# Patient Record
Sex: Male | Born: 1990 | Race: White | Hispanic: No | Marital: Single | Smoking: Never smoker
Health system: Southern US, Community
[De-identification: ages and names within clinical notes are randomized; demographics above are authoritative.]

## PROBLEM LIST (undated history)

## (undated) DIAGNOSIS — K769 Liver disease, unspecified: Secondary | ICD-10-CM

## (undated) HISTORY — PX: OTHER SURGICAL HISTORY: SHX169

---

## 2019-05-28 ENCOUNTER — Other Ambulatory Visit: Payer: Self-pay

## 2019-05-28 ENCOUNTER — Emergency Department (HOSPITAL_COMMUNITY)
Admission: EM | Admit: 2019-05-28 | Discharge: 2019-05-28 | Disposition: A | Discharge status: Home / Self Care | Payer: Self-pay | Attending: Emergency Medicine | Admitting: Emergency Medicine

## 2019-05-28 ENCOUNTER — Encounter (HOSPITAL_COMMUNITY): Payer: Self-pay | Admitting: *Deleted

## 2019-05-28 ENCOUNTER — Emergency Department (HOSPITAL_COMMUNITY): Payer: Self-pay

## 2019-05-28 DIAGNOSIS — R059 Cough, unspecified: Secondary | ICD-10-CM

## 2019-05-28 DIAGNOSIS — R509 Fever, unspecified: Secondary | ICD-10-CM | POA: Insufficient documentation

## 2019-05-28 DIAGNOSIS — R112 Nausea with vomiting, unspecified: Secondary | ICD-10-CM | POA: Insufficient documentation

## 2019-05-28 DIAGNOSIS — Z20822 Contact with and (suspected) exposure to covid-19: Secondary | ICD-10-CM | POA: Insufficient documentation

## 2019-05-28 DIAGNOSIS — R05 Cough: Secondary | ICD-10-CM | POA: Insufficient documentation

## 2019-05-28 HISTORY — DX: Liver disease, unspecified: K76.9

## 2019-05-28 LAB — COMPREHENSIVE METABOLIC PANEL
ALT: 21 U/L (ref 0–44)
AST: 21 U/L (ref 15–41)
Albumin: 3.9 g/dL (ref 3.5–5.0)
Alkaline Phosphatase: 61 U/L (ref 38–126)
Anion gap: 15 (ref 5–15)
BUN: 18 mg/dL (ref 6–20)
CO2: 19 mmol/L — ABNORMAL LOW (ref 22–32)
Calcium: 9.6 mg/dL (ref 8.9–10.3)
Chloride: 103 mmol/L (ref 98–111)
Creatinine, Ser: 0.89 mg/dL (ref 0.61–1.24)
GFR calc Af Amer: >60 mL/min (ref >60)
GFR calc non Af Amer: >60 mL/min (ref >60)
Glucose, Bld: 85 mg/dL (ref 70–99)
Potassium: 4.2 mmol/L (ref 3.5–5.1)
Sodium: 137 mmol/L (ref 135–145)
Total Bilirubin: 1.3 mg/dL — ABNORMAL HIGH (ref 0.3–1.2)
Total Protein: 8.2 g/dL — ABNORMAL HIGH (ref 6.5–8.1)

## 2019-05-28 LAB — CBC WITH DIFFERENTIAL/PLATELET
Abs Immature Granulocytes: 0.03 K/uL (ref 0.00–0.07)
Basophils Absolute: 0 K/uL (ref 0.0–0.1)
Basophils Relative: 0 %
Eosinophils Absolute: 0.2 K/uL (ref 0.0–0.5)
Eosinophils Relative: 2 %
HCT: 45.6 % (ref 39.0–52.0)
Hemoglobin: 15.3 g/dL (ref 13.0–17.0)
Immature Granulocytes: 0 %
Lymphocytes Relative: 24 %
Lymphs Abs: 2.2 K/uL (ref 0.7–4.0)
MCH: 29.4 pg (ref 26.0–34.0)
MCHC: 33.6 g/dL (ref 30.0–36.0)
MCV: 87.5 fL (ref 80.0–100.0)
Monocytes Absolute: 1.3 K/uL — ABNORMAL HIGH (ref 0.1–1.0)
Monocytes Relative: 14 %
Neutro Abs: 5.5 K/uL (ref 1.7–7.7)
Neutrophils Relative %: 60 %
Platelets: 319 K/uL (ref 150–400)
RBC: 5.21 MIL/uL (ref 4.22–5.81)
RDW: 12.8 % (ref 11.5–15.5)
WBC: 9.3 K/uL (ref 4.0–10.5)
nRBC: 0 % (ref 0.0–0.2)

## 2019-05-28 LAB — POC SARS CORONAVIRUS 2 AG -  ED: SARS Coronavirus 2 Ag: NEGATIVE

## 2019-05-28 LAB — LIPASE, BLOOD: Lipase: 17 U/L (ref 11–51)

## 2019-05-28 MED ORDER — SODIUM CHLORIDE 0.9 % IV BOLUS
1000.0000 mL | Freq: Once | INTRAVENOUS | Status: AC
  Administered 2019-05-28: 17:00:00 1000 mL via INTRAVENOUS

## 2019-05-28 MED ORDER — BENZONATATE 100 MG PO CAPS: 100.0000 mg | ORAL_CAPSULE | Freq: Three times a day (TID) | ORAL | 0 refills | Status: AC

## 2019-05-28 NOTE — ED Triage Notes (Signed)
Pt reports having a productive cough x 4-5 days with fever. Mask on pt, no distress noted at triage.

## 2019-05-28 NOTE — Discharge Instructions (Addendum)
Your laboratory results are within normal limits today.  Your chest x-ray did not show any pneumonia.  I prescribed a short course of Acacian to help with your cough, please take these as directed.  You may continue to treat your fever at home with Tylenol, push fluids along with follow-up with PCP after isolation for the next 14 days.

## 2019-05-28 NOTE — ED Provider Notes (Signed)
[facility] EMERGENCY DEPARTMENT Provider Note   CSN: [id number] Arrival date & time: 05/28/19  0940     History Chief Complaint  Patient presents with  . Fever  . Cough    Donald Stevenson is a 29 y.o. male.  29 y.o male with a PMH of liver disease presents to the ED with a chief complaint of generalized body aches, fevers, cough.  Patient reports a friend of his tested positive for Covid, he was told this after the fact.  Today he is chest pressure with coughing.  He also reports T-max of 101 while at home, he has been taking Tylenol along with Robitussin for his symptoms without improvement.  He also endorses lower abdominal pain, states this is sharp, worse with movement along with eating.  He has had multiple episodes of nonbilious, nonbloody emesis.  Reports last time he ate was 3 days ago.  He denies any headache, prior history of CAD, smoking history or PMH of blood clots.     The history is provided by the patient and medical records.  Fever Associated symptoms: cough, nausea, rhinorrhea and vomiting   Associated symptoms: no chest pain, no headaches and no sore throat   Cough Associated symptoms: fever and rhinorrhea   Associated symptoms: no chest pain, no headaches and no sore throat        Past Medical History:  Diagnosis Date  . Liver disease      Social History   Tobacco Use  . Smoking status: Not on file  Substance Use Topics  . Alcohol use: Not on file  . Drug use: Yes    Types: Marijuana    Home Medications Prior to Admission medications   Medication Sig Start Date End Date Taking? Authorizing Provider  benzonatate (TESSALON) 100 MG capsule Take 1 capsule (100 mg total) by mouth every 8 (eight) hours for 7 days. 05/28/19 06/04/19  Claude Manges, PA-C    Allergies    Morphine and related and Phenergan [promethazine]  Review of Systems   Review of Systems  Constitutional: Positive for fever.  HENT: Positive for rhinorrhea.  Negative for sinus pressure, sinus pain and sore throat.   Respiratory: Positive for cough and chest tightness.   Cardiovascular: Negative for chest pain.  Gastrointestinal: Positive for abdominal pain, nausea and vomiting.  Genitourinary: Negative for flank pain.  Musculoskeletal: Negative for back pain.  Skin: Negative for pallor and wound.  Neurological: Negative for light-headedness and headaches.    Physical Exam Updated Vital Signs BP 123/89   Pulse 85   Temp 98.1 F (36.7 C) (Oral)   Resp 17   Ht 5\' 6"  (1.676 m)   Wt 59 kg   SpO2 96%   BMI 20.98 kg/m   Physical Exam Vitals and nursing note reviewed.  Constitutional:      Appearance: Normal appearance.  HENT:     Head: Normocephalic and atraumatic.     Nose: Rhinorrhea present.     Mouth/Throat:     Mouth: Mucous membranes are dry.  Eyes:     Pupils: Pupils are equal, round, and reactive to light.  Cardiovascular:     Rate and Rhythm: Normal rate.  Pulmonary:     Effort: Pulmonary effort is normal.  Abdominal:     General: Abdomen is flat.     Tenderness: There is abdominal tenderness. There is guarding. There is no right CVA tenderness, left CVA tenderness or rebound.     Hernia: No hernia is  present.  Musculoskeletal:     Cervical back: Normal range of motion and neck supple.  Skin:    General: Skin is warm and dry.  Neurological:     Mental Status: He is alert and oriented to person, place, and time.     ED Results / Procedures / Treatments   Labs (all labs ordered are listed, but only abnormal results are displayed) Labs Reviewed  CBC WITH DIFFERENTIAL/PLATELET - Abnormal; Notable for the following components:      Result Value   Monocytes Absolute 1.3 (*)    All other components within normal limits  COMPREHENSIVE METABOLIC PANEL - Abnormal; Notable for the following components:   CO2 19 (*)    Total Protein 8.2 (*)    Total Bilirubin 1.3 (*)    All other components within normal limits    LIPASE, BLOOD  POC SARS CORONAVIRUS 2 AG -  ED    EKG None  Radiology DG Chest Portable 1 View  Result Date: 05/28/2019 CLINICAL DATA:  Cough. EXAM: PORTABLE CHEST 1 VIEW COMPARISON:  None. FINDINGS: The heart size and mediastinal contours are within normal limits. Both lungs are clear. The visualized skeletal structures are unremarkable. IMPRESSION: No active disease. Electronically Signed   By: Aram Candela M.D.   On: 05/28/2019 16:29    Procedures Procedures (including critical care time)  Medications Ordered in ED Medications  sodium chloride 0.9 % bolus 1,000 mL (1,000 mLs Intravenous New Bag/Given 05/28/19 1717)    ED Course  I have reviewed the triage vital signs and the nursing notes.  Pertinent labs & imaging results that were available during my care of the patient were reviewed by me and considered in my medical decision making (see chart for details).    MDM Rules/Calculators/A&P   Patient with a past medical history of liver disease presents to the ED with complaints of cough, body aches, fevers for the past several days.  According to patient he was exposed to his friend who tested positive for COVID-19 infection.  He reports has been treating his symptoms at home with Robitussin, Tylenol, fluids but has not had any improvement in symptoms.  He reports the cough has been severe nature, he has had some nausea along with episodes of post tussive emesis.  He also endorses anorexia, states his last meal was 2 days ago.  He arrived in the ED afebrile, heart rate of 126, reports he had just finished vomiting in the parking lot.  Patient is saturations around 97% on room air.  No tachypnea present.  He does endorse some abdominal pain, more so on the lower aspect, does not have any urinary symptoms.  Basic blood work was obtained for further screening.  CBC with no leukocytosis, hemoglobin is within normal limits. CMP without any electrolyte abnormality.  Creatinine level  is within normal limits.  LFTs are unremarkable, no signs of injury at this time.  Lipase level is within normal limits.  Rapid Covid test was obtained which was negative.  Chest x-ray did not show any signs of consolidation, pneumothorax, pleural effusion.  Patient received a liter bolus while in the ED,  Derwood Becraft was evaluated in Emergency Department on 05/28/2019 for the symptoms described in the history of present illness. He was evaluated in the context of the global COVID-19 pandemic, which necessitated consideration that the patient might be at risk for infection with the SARS-CoV-2 virus that causes COVID-19. Institutional protocols and algorithms that pertain to the evaluation of  patients at risk for COVID-19 are in a state of rapid change based on information released by regulatory bodies including the CDC and federal and state organizations. These policies and algorithms were followed during the patient's care in the ED.    Portions of this note were generated with [employer]. Dictation errors may occur despite best attempts at proofreading.  Final Clinical Impression(s) / ED Diagnoses Final diagnoses:  Cough  Suspected COVID-19 virus infection    Rx / DC Orders ED Discharge Orders         Ordered    benzonatate (TESSALON) 100 MG capsule  Every 8 hours     05/28/19 1802           Janeece Fitting, PA-C 05/28/19 1814    Dorie Rank, MD 05/29/19 2311

## 2019-12-11 ENCOUNTER — Other Ambulatory Visit: Payer: Self-pay

## 2019-12-11 ENCOUNTER — Encounter (HOSPITAL_COMMUNITY): Payer: Self-pay | Admitting: Emergency Medicine

## 2019-12-11 ENCOUNTER — Emergency Department (HOSPITAL_COMMUNITY): Payer: Self-pay

## 2019-12-11 ENCOUNTER — Emergency Department (HOSPITAL_COMMUNITY)
Admission: EM | Admit: 2019-12-11 | Discharge: 2019-12-12 | Disposition: A | Discharge status: Home / Self Care | Payer: Self-pay | Attending: Emergency Medicine | Admitting: Emergency Medicine

## 2019-12-11 DIAGNOSIS — R05 Cough: Secondary | ICD-10-CM | POA: Insufficient documentation

## 2019-12-11 DIAGNOSIS — R0789 Other chest pain: Secondary | ICD-10-CM | POA: Insufficient documentation

## 2019-12-11 DIAGNOSIS — Z5321 Procedure and treatment not carried out due to patient leaving prior to being seen by health care provider: Secondary | ICD-10-CM | POA: Insufficient documentation

## 2019-12-11 DIAGNOSIS — R0602 Shortness of breath: Secondary | ICD-10-CM | POA: Insufficient documentation

## 2019-12-11 DIAGNOSIS — R1110 Vomiting, unspecified: Secondary | ICD-10-CM | POA: Insufficient documentation

## 2019-12-11 LAB — CBC
HCT: 43.6 % (ref 39.0–52.0)
Hemoglobin: 14.2 g/dL (ref 13.0–17.0)
MCH: 29.6 pg (ref 26.0–34.0)
MCHC: 32.6 g/dL (ref 30.0–36.0)
MCV: 91 fL (ref 80.0–100.0)
Platelets: 341 10*3/uL (ref 150–400)
RBC: 4.79 MIL/uL (ref 4.22–5.81)
RDW: 13.2 % (ref 11.5–15.5)
WBC: 10.2 10*3/uL (ref 4.0–10.5)
nRBC: 0 % (ref 0.0–0.2)

## 2019-12-11 MED ORDER — SODIUM CHLORIDE 0.9% FLUSH: 3.0000 mL | Freq: Once | INTRAVENOUS | Status: DC

## 2019-12-11 NOTE — ED Triage Notes (Signed)
Patient reports chronic left upper chest pain for 3 months with SOB , emesis and occasional dry cough , denies fever or diaphoresis .

## 2019-12-12 ENCOUNTER — Other Ambulatory Visit: Payer: Self-pay

## 2019-12-12 ENCOUNTER — Emergency Department (HOSPITAL_COMMUNITY)
Admission: EM | Admit: 2019-12-12 | Discharge: 2019-12-12 | Disposition: A | Discharge status: Home / Self Care | Payer: Self-pay | Attending: Emergency Medicine | Admitting: Emergency Medicine

## 2019-12-12 ENCOUNTER — Emergency Department (HOSPITAL_COMMUNITY): Payer: Self-pay

## 2019-12-12 ENCOUNTER — Encounter (HOSPITAL_COMMUNITY): Payer: Self-pay | Admitting: Emergency Medicine

## 2019-12-12 DIAGNOSIS — R0789 Other chest pain: Secondary | ICD-10-CM | POA: Insufficient documentation

## 2019-12-12 DIAGNOSIS — Z20822 Contact with and (suspected) exposure to covid-19: Secondary | ICD-10-CM | POA: Insufficient documentation

## 2019-12-12 DIAGNOSIS — R079 Chest pain, unspecified: Secondary | ICD-10-CM

## 2019-12-12 DIAGNOSIS — R109 Unspecified abdominal pain: Secondary | ICD-10-CM | POA: Insufficient documentation

## 2019-12-12 DIAGNOSIS — R5383 Other fatigue: Secondary | ICD-10-CM | POA: Insufficient documentation

## 2019-12-12 DIAGNOSIS — R112 Nausea with vomiting, unspecified: Secondary | ICD-10-CM | POA: Insufficient documentation

## 2019-12-12 LAB — BASIC METABOLIC PANEL
Anion gap: 11 (ref 5–15)
Anion gap: 8 (ref 5–15)
BUN: 11 mg/dL (ref 6–20)
BUN: 14 mg/dL (ref 6–20)
CO2: 26 mmol/L (ref 22–32)
CO2: 26 mmol/L (ref 22–32)
Calcium: 9.7 mg/dL (ref 8.9–10.3)
Calcium: 9.8 mg/dL (ref 8.9–10.3)
Chloride: 106 mmol/L (ref 98–111)
Chloride: 107 mmol/L (ref 98–111)
Creatinine, Ser: 0.72 mg/dL (ref 0.61–1.24)
Creatinine, Ser: 0.91 mg/dL (ref 0.61–1.24)
GFR calc Af Amer: >60 mL/min (ref >60)
GFR calc Af Amer: >60 mL/min (ref >60)
GFR calc non Af Amer: >60 mL/min (ref >60)
GFR calc non Af Amer: >60 mL/min (ref >60)
Glucose, Bld: 86 mg/dL (ref 70–99)
Glucose, Bld: 94 mg/dL (ref 70–99)
Potassium: 4.9 mmol/L (ref 3.5–5.1)
Potassium: 5 mmol/L (ref 3.5–5.1)
Sodium: 141 mmol/L (ref 135–145)
Sodium: 143 mmol/L (ref 135–145)

## 2019-12-12 LAB — HEPATIC FUNCTION PANEL
ALT: 16 U/L (ref 0–44)
AST: 19 U/L (ref 15–41)
Albumin: 4.6 g/dL (ref 3.5–5.0)
Alkaline Phosphatase: 55 U/L (ref 38–126)
Bilirubin, Direct: <0.1 mg/dL (ref 0.0–0.2)
Total Bilirubin: 0.7 mg/dL (ref 0.3–1.2)
Total Protein: 8.2 g/dL — ABNORMAL HIGH (ref 6.5–8.1)

## 2019-12-12 LAB — TROPONIN I (HIGH SENSITIVITY)
Troponin I (High Sensitivity): 2 ng/L (ref <18)
Troponin I (High Sensitivity): <2 ng/L (ref <18)
Troponin I (High Sensitivity): <2 ng/L (ref <18)
Troponin I (High Sensitivity): <2 ng/L (ref <18)

## 2019-12-12 LAB — CBC
HCT: 43.1 % (ref 39.0–52.0)
Hemoglobin: 14 g/dL (ref 13.0–17.0)
MCH: 29.7 pg (ref 26.0–34.0)
MCHC: 32.5 g/dL (ref 30.0–36.0)
MCV: 91.3 fL (ref 80.0–100.0)
Platelets: 322 10*3/uL (ref 150–400)
RBC: 4.72 MIL/uL (ref 4.22–5.81)
RDW: 13.3 % (ref 11.5–15.5)
WBC: 9.4 10*3/uL (ref 4.0–10.5)
nRBC: 0 % (ref 0.0–0.2)

## 2019-12-12 LAB — SARS CORONAVIRUS 2 BY RT PCR (HOSPITAL ORDER, PERFORMED IN ~~LOC~~ HOSPITAL LAB): SARS Coronavirus 2: NEGATIVE

## 2019-12-12 LAB — LIPASE, BLOOD: Lipase: 26 U/L (ref 11–51)

## 2019-12-12 MED ORDER — PANTOPRAZOLE SODIUM 20 MG PO TBEC
20.0000 mg | DELAYED_RELEASE_TABLET | Freq: Every day | ORAL | 0 refills | Status: AC
Start: 2019-12-12 — End: ?

## 2019-12-12 MED ORDER — SODIUM CHLORIDE 0.9% FLUSH: 3.0000 mL | Freq: Once | INTRAVENOUS | Status: DC

## 2019-12-12 NOTE — ED Provider Notes (Addendum)
[facility]-EMERGENCY DEPT Provider Note   CSN: [id number] Arrival date & time: 12/12/19  0720     History Chief Complaint  Patient presents with  . Chest Pain    Donald Stevenson is a 29 y.o. male.  HPI  29 year old male with history of liver disease, and self-reported gastric ulcer diagnosed in 2019, presents to the ER with fatigue, chest pain, and consistent nausea and vomiting in the mornings which then resolves throughout the day.  Patient states that he has been having most of the symptoms over the last 2 years, however the chest pain has become more constant this last week.  He reports pain to the left upper chest near his armpit, states that it is worse with sneezing or coughing and describes it as sharp and stabbing.  He endorses smoking marijuana, and states that he has stopped smoking this week because his chest pain has been getting worse.  He states that he probably should stop smoking long-term, with which I agreed.  Otherwise, he states that the pain is just waxes and wanes.  However over the last week this has become more consistent, even at rest and this prompted him to come to the ER.  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  He also reports some weight loss due to decreased appetite.  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  He states that when he was diagnosed with the ulcer, he was prescribed Protonix but never filled this prescription.  He states he now regrets it as he thinks that this is contributing to his symptoms.  Originally denying of abdominal pain, however upon further questioning he states that he will occasionally get some left lower quadrant stings but they are not consistent.  He denies any fevers or chills.  Endorses nausea and vomiting in the mornings, but then this improves throughout  the day.  He denies any shortness of breath, palpitations, headaches, constipation, diarrhea, dysuria.  He endorses a dry cough, but this has been consistent for a "long time".  He has not taken anything for his chest pain.  He denies any SI/HI, states that his mental health has been doing well.     Past Medical History:  Diagnosis Date  . Liver disease     There are no problems to display for this patient.   History reviewed. No pertinent surgical history.     History reviewed. No pertinent family history.  Social History   Tobacco Use  . Smoking status: Never Smoker  . Smokeless tobacco: Never Used  Substance Use Topics  . Alcohol use: Never  . Drug use: Yes    Types: Marijuana    Home Medications Prior to Admission medications   Medication Sig Start Date End Date Taking? Authorizing Provider  pantoprazole (PROTONIX) 20 MG tablet Take 1 tablet (20 mg total) by mouth daily. 12/12/19   Mare Ferrari, PA-C    Allergies    Morphine and related, Phenergan [promethazine], and Soy allergy  Review of Systems   Review of Systems  Constitutional: Positive for fatigue. Negative for chills and fever.  HENT: Negative for ear pain and sore throat.   Eyes: Negative for pain and visual disturbance.  Respiratory: Positive for cough. Negative for shortness of breath.   Cardiovascular: Positive  for chest pain. Negative for palpitations.  Gastrointestinal: Positive for abdominal pain, nausea and vomiting. Negative for constipation and diarrhea.  Genitourinary: Negative for dysuria and hematuria.  Musculoskeletal: Negative for arthralgias and back pain.  Skin: Negative for color change and rash.  Neurological: Negative for seizures and syncope.  All other systems reviewed and are negative.   Physical Exam Updated Vital Signs BP (!) 104/88 (BP Location: Right Arm)   Pulse 54   Temp 98.7 F (37.1 C) (Oral)   Resp 18   Ht 5\' 6"  (1.676 m)   Wt 56.7 kg   SpO2 100%   BMI 20.18  kg/m   Physical Exam Vitals and nursing note reviewed.  Constitutional:      General: He is not in acute distress.    Appearance: He is well-developed. He is not ill-appearing, toxic-appearing or diaphoretic.  HENT:     Head: Normocephalic and atraumatic.  Eyes:     Conjunctiva/sclera: Conjunctivae normal.  Cardiovascular:     Rate and Rhythm: Normal rate and regular rhythm.     Pulses:          Radial pulses are 2+ on the right side and 2+ on the left side.     Heart sounds: Normal heart sounds. No murmur heard.   Pulmonary:     Effort: Pulmonary effort is normal. No tachypnea, accessory muscle usage or respiratory distress.     Breath sounds: Normal breath sounds. No stridor. No decreased breath sounds, wheezing, rhonchi or rales.  Chest:     Chest wall: No deformity or tenderness.  Abdominal:     Palpations: Abdomen is soft.     Tenderness: There is no abdominal tenderness.  Musculoskeletal:        General: Normal range of motion.     Cervical back: Normal range of motion and neck supple.     Right lower leg: No tenderness.     Left lower leg: No tenderness. No edema.  Skin:    General: Skin is warm and dry.     Findings: No erythema or rash.  Neurological:     General: No focal deficit present.     Mental Status: He is alert.  Psychiatric:        Mood and Affect: Mood normal.        Behavior: Behavior normal.     ED Results / Procedures / Treatments   Labs (all labs ordered are listed, but only abnormal results are displayed) Labs Reviewed  HEPATIC FUNCTION PANEL - Abnormal; Notable for the following components:      Result Value   Total Protein 8.2 (*)    All other components within normal limits  SARS CORONAVIRUS 2 BY RT PCR (HOSPITAL ORDER, PERFORMED IN [facility])  BASIC METABOLIC PANEL  CBC  LIPASE, BLOOD  TROPONIN I (HIGH SENSITIVITY)  TROPONIN I (HIGH SENSITIVITY)    EKG EKG Interpretation  Date/Time:  Thursday December 12 2019  07:33:36 EDT Ventricular Rate:  59 PR Interval:    QRS Duration: 92 QT Interval:  424 QTC Calculation: 420 R Axis:   67 Text Interpretation: Sinus arrhythmia RSR' in V1 or V2, right VCD or RVH ST elev, probable normal early repol pattern 12 Lead; Mason-Likar No significant change since last tracing Confirmed by [id number] (Gwyneth Sprout) on 12/12/2019 10:03:39 AM   Radiology DG Chest 2 View  Result Date: 12/12/2019 CLINICAL DATA:  Pt c/o left pectoral chest pain x 2 months, worse this week, waking  patient and preventing sleep last night, hx smoker, denies any chest hx. EXAM: CHEST - 2 VIEW COMPARISON:  Chest radiograph 12/11/2019 FINDINGS: The heart size and mediastinal contours are within normal limits. Both lungs are clear. No pneumothorax or pleural effusion. The visualized skeletal structures are unremarkable. IMPRESSION: No acute cardiopulmonary process. Electronically Signed   By: Emmaline KluverNancy  Ballantyne M.D.   On: 12/12/2019 08:15   DG Chest 2 View  Result Date: 12/11/2019 CLINICAL DATA:  Chest pain EXAM: CHEST - 2 VIEW COMPARISON:  05/28/2019 FINDINGS: The heart size and mediastinal contours are within normal limits. Both lungs are clear. The visualized skeletal structures are unremarkable. IMPRESSION: No active cardiopulmonary disease. Electronically Signed   By: Jasmine PangKim  Fujinaga M.D.   On: 12/11/2019 23:42    Procedures Procedures (including critical care time)  Medications Ordered in ED Medications  sodium chloride flush (NS) 0.9 % injection 3 mL (3 mLs Intravenous Refused 12/12/19 [id number])    ED Course  I have reviewed the triage vital signs and the nursing notes.  Pertinent labs & imaging results that were available during my care of the patient were reviewed by me and considered in my medical decision making (see chart for details).    MDM Rules/Calculators/A&P                         29 year old male with left upper chest pain, fatigue On presentation, the patient is alert and  oriented, nontoxic-appearing, in no acute distress, speaking full sentences without increased work of breathing, nondiaphoretic.  Vitals are overall very reassuring, with no abnormalities.  Physical exam with clear lung sounds, regular rate and rhythm, mild left lower quadrant tenderness, without peritoneal signs, no rebound, no guarding.  I personally ordered and interpreted his lab work, there is no evidence of infection with a CBC without leukocytosis, normal hemoglobin.  This would not explain his fatigue.  BMP without any significant electrolyte abnormalities, normal renal function.  Normal glucose, anion gap. No evidence of new onset diabetes.  Initial troponin less than 2, given how long the patient has been here in the ER, I do not think that he needs a second troponin at this time.  Lipase normal.  Hepatic function panel with a slightly increased total protein of 8.2, however normal AST and ALT.  Chest x-ray without any evidence of pneumonia, pneumothorax, masses.  EKG with no significant changes since last tracing.  Suspicion for Covid low, but will swab today.  No evidence of ACS, doubt PE as the patient is not tachycardic, hypoxic, or short of breath.  Labs and physical exam of the abdomen do not suggest perforated ulcer, or any other surgical abdomen.  Partook in shared decision making, given the patient is uninsured, and his work-up was overall reassuring--his chest pain and abdominal pain with low concern for PE, ACS, pneumonia, perforated ulcer, surgical abdomen, patient had decided that we will forego any CT imaging of the chest and abdomen today.  He is requesting a Protonix refill, which I am happy to provide.  Patient really would benefit from further GI follow-up/evaluation.  I suspect that his chest pain could be likely caused by GERD.  I will provide a referral to him, and encouraged him to establish with a primary care doctor as well.  Unclear source of his fatigue, as his labs are normal,  and this has been a long time ongoing process.  Doubt Covid would be contributing to this.  Strict return  precautions discussed, and patient voiced understanding.  All of his questions have been answered to his satisfaction, he voices understanding and is agreeable to this plan.  At this stage in the ED course, the patient is medically screened and is stable for discharge.     Final Clinical Impression(s) / ED Diagnoses Final diagnoses:  Chest pain, unspecified type    Rx / DC Orders ED Discharge Orders         Ordered    pantoprazole (PROTONIX) 20 MG tablet  Daily     Discontinue  Reprint     12/12/19 [address] 12/12/19 1125    Gwyneth Sprout, MD 12/12/19 2157

## 2019-12-12 NOTE — Discharge Instructions (Signed)
Your lab work and work-up today was overall reassuring.  We did swab you for Covid today given you have a cough and fatigue, please make sure to quarantine until you receive the results.  If this is positive, you will need to quarantine for an additional 7 to 10 days.  Please make sure to take the Protonix as prescribed, and follow-up with the GI doctor whose contact information is provided in your discharge paperwork.  Please also call the phone number on your discharge paperwork to establish with a primary care doctor.  If you cannot afford a primary care doctor, I provided the contact information of [facility] which is a local free clinic.  If at any point you have any new or worsening symptoms, make sure to return to the ER.

## 2019-12-12 NOTE — ED Notes (Signed)
Pt checked out AMA. 

## 2019-12-12 NOTE — ED Triage Notes (Signed)
Pt comes in for chest pain that has been going on for 3 months. Pt states its been gradually getting worse for that last 3 days. Pt went to [facility] yesterday, left after blood work. Pain got worse after pt got home and went to bed.

## 2019-12-12 NOTE — ED Notes (Signed)
Patient has a extra blue top in the main lab 

## 2020-01-28 ENCOUNTER — Emergency Department (HOSPITAL_BASED_OUTPATIENT_CLINIC_OR_DEPARTMENT_OTHER): Payer: Self-pay

## 2020-01-28 ENCOUNTER — Encounter (HOSPITAL_BASED_OUTPATIENT_CLINIC_OR_DEPARTMENT_OTHER): Payer: Self-pay

## 2020-01-28 ENCOUNTER — Other Ambulatory Visit: Payer: Self-pay

## 2020-01-28 DIAGNOSIS — Z5321 Procedure and treatment not carried out due to patient leaving prior to being seen by health care provider: Secondary | ICD-10-CM | POA: Insufficient documentation

## 2020-01-28 DIAGNOSIS — Y9301 Activity, walking, marching and hiking: Secondary | ICD-10-CM | POA: Insufficient documentation

## 2020-01-28 DIAGNOSIS — Y92828 Other wilderness area as the place of occurrence of the external cause: Secondary | ICD-10-CM | POA: Insufficient documentation

## 2020-01-28 DIAGNOSIS — S0181XA Laceration without foreign body of other part of head, initial encounter: Secondary | ICD-10-CM | POA: Insufficient documentation

## 2020-01-28 DIAGNOSIS — Y999 Unspecified external cause status: Secondary | ICD-10-CM | POA: Insufficient documentation

## 2020-01-28 DIAGNOSIS — S40212A Abrasion of left shoulder, initial encounter: Secondary | ICD-10-CM | POA: Insufficient documentation

## 2020-01-28 DIAGNOSIS — W1781XA Fall down embankment (hill), initial encounter: Secondary | ICD-10-CM | POA: Insufficient documentation

## 2020-01-28 NOTE — ED Triage Notes (Signed)
Pt arrvies with laceration and hematoma to left forehead and abrasion to right forehead, reports he was hiking today when he tried to keep his dog from sliding and fell down a hill hitting his head on rocks. Pt also c/o pain to left shoulder with abrasion. He was with his brother who told him he passed out for about 10-15 seconds following the event. They drove hove home, he attempted to eat and vomited.

## 2020-01-29 ENCOUNTER — Emergency Department (HOSPITAL_BASED_OUTPATIENT_CLINIC_OR_DEPARTMENT_OTHER)
Admission: EM | Admit: 2020-01-29 | Discharge: 2020-01-29 | Disposition: A | Discharge status: Home / Self Care | Payer: Self-pay | Attending: Emergency Medicine | Admitting: Emergency Medicine

## 2020-01-29 DIAGNOSIS — W19XXXA Unspecified fall, initial encounter: Secondary | ICD-10-CM

## 2020-03-05 ENCOUNTER — Other Ambulatory Visit: Payer: Self-pay

## 2020-03-05 ENCOUNTER — Emergency Department (HOSPITAL_COMMUNITY)
Admission: EM | Admit: 2020-03-05 | Discharge: 2020-03-05 | Disposition: A | Discharge status: Home / Self Care | Payer: Medicaid Other | Attending: Emergency Medicine | Admitting: Emergency Medicine

## 2020-03-05 DIAGNOSIS — K625 Hemorrhage of anus and rectum: Secondary | ICD-10-CM | POA: Insufficient documentation

## 2020-03-05 LAB — CBC WITH DIFFERENTIAL/PLATELET
Abs Immature Granulocytes: 0.02 10*3/uL (ref 0.00–0.07)
Basophils Absolute: 0 10*3/uL (ref 0.0–0.1)
Basophils Relative: 0 %
Eosinophils Absolute: 0.1 10*3/uL (ref 0.0–0.5)
Eosinophils Relative: 1 %
HCT: 41 % (ref 39.0–52.0)
Hemoglobin: 13.9 g/dL (ref 13.0–17.0)
Immature Granulocytes: 0 %
Lymphocytes Relative: 31 %
Lymphs Abs: 2.9 10*3/uL (ref 0.7–4.0)
MCH: 30.2 pg (ref 26.0–34.0)
MCHC: 33.9 g/dL (ref 30.0–36.0)
MCV: 89.1 fL (ref 80.0–100.0)
Monocytes Absolute: 0.8 10*3/uL (ref 0.1–1.0)
Monocytes Relative: 9 %
Neutro Abs: 5.6 10*3/uL (ref 1.7–7.7)
Neutrophils Relative %: 59 %
Platelets: 275 10*3/uL (ref 150–400)
RBC: 4.6 MIL/uL (ref 4.22–5.81)
RDW: 14 % (ref 11.5–15.5)
WBC: 9.4 10*3/uL (ref 4.0–10.5)
nRBC: 0 % (ref 0.0–0.2)

## 2020-03-05 LAB — COMPREHENSIVE METABOLIC PANEL
ALT: 14 U/L (ref 0–44)
AST: 19 U/L (ref 15–41)
Albumin: 4.3 g/dL (ref 3.5–5.0)
Alkaline Phosphatase: 47 U/L (ref 38–126)
Anion gap: 13 (ref 5–15)
BUN: 18 mg/dL (ref 6–20)
CO2: 27 mmol/L (ref 22–32)
Calcium: 9.8 mg/dL (ref 8.9–10.3)
Chloride: 103 mmol/L (ref 98–111)
Creatinine, Ser: 0.88 mg/dL (ref 0.61–1.24)
GFR, Estimated: >60 mL/min (ref >60)
Glucose, Bld: 88 mg/dL (ref 70–99)
Potassium: 3.6 mmol/L (ref 3.5–5.1)
Sodium: 143 mmol/L (ref 135–145)
Total Bilirubin: 0.7 mg/dL (ref 0.3–1.2)
Total Protein: 7.9 g/dL (ref 6.5–8.1)

## 2020-03-05 LAB — TYPE AND SCREEN
ABO/RH(D): A POS
Antibody Screen: NEGATIVE

## 2020-03-05 LAB — LIPASE, BLOOD: Lipase: 25 U/L (ref 11–51)

## 2020-03-05 LAB — POC OCCULT BLOOD, ED: Fecal Occult Bld: NEGATIVE

## 2020-03-05 MED ORDER — PANTOPRAZOLE SODIUM 20 MG PO TBEC
20.0000 mg | DELAYED_RELEASE_TABLET | Freq: Every day | ORAL | 1 refills | Status: AC
Start: 2020-03-05 — End: ?

## 2020-03-05 NOTE — ED Triage Notes (Signed)
Patient here for melena, seen today and two days ago, previous episode in 2019. Patient had endoscopy/colonoscopy in 2019, diagnosed with stomach ulcer and liver disease, no testing since. Blood in stool was bright red two days ago, blood today was dark red. Patient endorses nausea/vomiting/weight loss for the past year. Patient denies diarrhea or fevers, denies any surgical history. Patient was prescribed Protonix several months ago, states it seemed to help, is currently out of med. Patient also endorses epigastric abdominal pain that radiates to the right and left sides. Patient endorses smoking weed but denies alcohol or "hard drug" use.

## 2020-03-05 NOTE — ED Provider Notes (Signed)
[name]-EMERGENCY DEPT Provider Note   CSN: [id number] Arrival date & time: 03/05/20  1903     History Chief Complaint  Patient presents with  . Melena  . Abdominal Pain    Donald Stevenson is a 29 y.o. male.  29 year old male with prior medical history as detailed below presents for evaluation. Patient reports intermittent rectal bleeding over the last 2 to 3 days. Patient denies nausea, vomiting, bloody emesis, belly pain, fever, or other complaint. Patient denies recent alcohol use. Patient reports that he saw bright red blood per rectum with attempted BM 2 days ago. Patient denies any bleeding in the last 24 hours.  The history is provided by the patient and medical records.  Illness Location:  Rectal bleeding  Severity:  Mild Onset quality:  Gradual Timing:  Constant Progression:  Waxing and waning Chronicity:  New Associated symptoms: no fever        Past Medical History:  Diagnosis Date  . Liver disease     There are no problems to display for this patient.   No past surgical history on file.     No family history on file.  Social History   Tobacco Use  . Smoking status: Never Smoker  . Smokeless tobacco: Never Used  Vaping Use  . Vaping Use: Never used  Substance Use Topics  . Alcohol use: Never  . Drug use: Not Currently    Types: Marijuana    Home Medications Prior to Admission medications   Medication Sig Start Date End Date Taking? Authorizing Provider  pantoprazole (PROTONIX) 20 MG tablet Take 1 tablet (20 mg total) by mouth daily. 12/12/19   Mare Ferrari, PA-C    Allergies    Soy allergy, Morphine and related, and Phenergan [promethazine]  Review of Systems   Review of Systems  Constitutional: Negative for fever.  All other systems reviewed and are negative.   Physical Exam Updated Vital Signs BP 111/79 (BP Location: Left Arm)   Pulse (!) 109   Temp 98.4 F (36.9 C) (Oral)   Resp 15   Ht 5\' 6"   (1.676 m)   Wt 52.6 kg   SpO2 99%   BMI 18.72 kg/m   Physical Exam Vitals and nursing note reviewed.  Constitutional:      General: He is not in acute distress.    Appearance: He is well-developed.  HENT:     Head: Normocephalic and atraumatic.  Eyes:     Conjunctiva/sclera: Conjunctivae normal.     Pupils: Pupils are equal, round, and reactive to light.  Cardiovascular:     Rate and Rhythm: Normal rate and regular rhythm.     Heart sounds: Normal heart sounds.  Pulmonary:     Effort: Pulmonary effort is normal. No respiratory distress.     Breath sounds: Normal breath sounds.  Abdominal:     General: There is no distension.     Palpations: Abdomen is soft.     Tenderness: There is no abdominal tenderness.  Genitourinary:    Comments: Normal stool, no gross blood - RN Chaperone present  Musculoskeletal:        General: No deformity. Normal range of motion.     Cervical back: Normal range of motion and neck supple.  Skin:    General: Skin is warm and dry.  Neurological:     Mental Status: He is alert and oriented to person, place, and time.     ED Results / Procedures / Treatments  Labs (all labs ordered are listed, but only abnormal results are displayed) Labs Reviewed  COMPREHENSIVE METABOLIC PANEL  CBC WITH DIFFERENTIAL/PLATELET  LIPASE, BLOOD  POC OCCULT BLOOD, ED  TYPE AND SCREEN  ABO/RH    EKG None  Radiology No results found.  Procedures Procedures (including critical care time)  Medications Ordered in ED Medications - No data to display  ED Course  I have reviewed the triage vital signs and the nursing notes.  Pertinent labs & imaging results that were available during my care of the patient were reviewed by me and considered in my medical decision making (see chart for details).    MDM Rules/Calculators/A&P                          MDM  Screen complete  Donald Stevenson was evaluated in Emergency Department on 03/05/2020 for the  symptoms described in the history of present illness. He was evaluated in the context of the global COVID-19 pandemic, which necessitated consideration that the patient might be at risk for infection with the SARS-CoV-2 virus that causes COVID-19. Institutional protocols and algorithms that pertain to the evaluation of patients at risk for COVID-19 are in a state of rapid change based on information released by regulatory bodies including the CDC and federal and state organizations. These policies and algorithms were followed during the patient's care in the ED.   Patient is presenting for reported rectal bleeding.  Patient without evidence of rectal bleeding upon this evaluation. Screening labs are without evidence of acute pathology. Patient does appear to be appropriate for discharge.  Patient does understand the need for close follow-up with GI.  Strict return precautions given and understood.   Final Clinical Impression(s) / ED Diagnoses Final diagnoses:  Rectal bleeding    Rx / DC Orders ED Discharge Orders         Ordered    pantoprazole (PROTONIX) 20 MG tablet  Daily        03/05/20 2048           Wynetta Fines, MD 03/05/20 2111

## 2020-03-05 NOTE — Discharge Instructions (Addendum)
Please return for any problem.  °

## 2020-07-03 ENCOUNTER — Emergency Department (HOSPITAL_COMMUNITY): Payer: Self-pay

## 2020-07-03 ENCOUNTER — Other Ambulatory Visit: Payer: Self-pay

## 2020-07-03 ENCOUNTER — Emergency Department (HOSPITAL_COMMUNITY)
Admission: EM | Admit: 2020-07-03 | Discharge: 2020-07-03 | Disposition: A | Discharge status: Home / Self Care | Payer: Self-pay | Attending: Emergency Medicine | Admitting: Emergency Medicine

## 2020-07-03 ENCOUNTER — Encounter (HOSPITAL_COMMUNITY): Payer: Self-pay

## 2020-07-03 DIAGNOSIS — R079 Chest pain, unspecified: Secondary | ICD-10-CM | POA: Insufficient documentation

## 2020-07-03 DIAGNOSIS — Z5321 Procedure and treatment not carried out due to patient leaving prior to being seen by health care provider: Secondary | ICD-10-CM | POA: Insufficient documentation

## 2020-07-03 LAB — CBC
HCT: 44.8 % (ref 39.0–52.0)
Hemoglobin: 14.5 g/dL (ref 13.0–17.0)
MCH: 29.5 pg (ref 26.0–34.0)
MCHC: 32.4 g/dL (ref 30.0–36.0)
MCV: 91.2 fL (ref 80.0–100.0)
Platelets: 271 10*3/uL (ref 150–400)
RBC: 4.91 MIL/uL (ref 4.22–5.81)
RDW: 13.6 % (ref 11.5–15.5)
WBC: 7.5 10*3/uL (ref 4.0–10.5)
nRBC: 0 % (ref 0.0–0.2)

## 2020-07-03 LAB — BASIC METABOLIC PANEL
Anion gap: 9 (ref 5–15)
BUN: 13 mg/dL (ref 6–20)
CO2: 26 mmol/L (ref 22–32)
Calcium: 9.6 mg/dL (ref 8.9–10.3)
Chloride: 107 mmol/L (ref 98–111)
Creatinine, Ser: 0.69 mg/dL (ref 0.61–1.24)
GFR, Estimated: >60 mL/min (ref >60)
Glucose, Bld: 105 mg/dL — ABNORMAL HIGH (ref 70–99)
Potassium: 4.7 mmol/L (ref 3.5–5.1)
Sodium: 142 mmol/L (ref 135–145)

## 2020-07-03 LAB — TROPONIN I (HIGH SENSITIVITY): Troponin I (High Sensitivity): <2 ng/L (ref <18)

## 2020-07-03 NOTE — ED Triage Notes (Signed)
Pt arrived via walk in, states he started with chest pain last night, while reading book. States worsening this morning, left sided, worsening with deep breathing.

## 2020-09-27 ENCOUNTER — Emergency Department (HOSPITAL_BASED_OUTPATIENT_CLINIC_OR_DEPARTMENT_OTHER)
Admission: EM | Admit: 2020-09-27 | Discharge: 2020-09-27 | Disposition: A | Discharge status: Home / Self Care | Payer: Medicaid Other | Attending: Emergency Medicine | Admitting: Emergency Medicine

## 2020-09-27 ENCOUNTER — Other Ambulatory Visit: Payer: Self-pay

## 2020-09-27 ENCOUNTER — Emergency Department (HOSPITAL_BASED_OUTPATIENT_CLINIC_OR_DEPARTMENT_OTHER): Payer: Medicaid Other

## 2020-09-27 ENCOUNTER — Encounter (HOSPITAL_BASED_OUTPATIENT_CLINIC_OR_DEPARTMENT_OTHER): Payer: Self-pay | Admitting: *Deleted

## 2020-09-27 DIAGNOSIS — Z23 Encounter for immunization: Secondary | ICD-10-CM | POA: Insufficient documentation

## 2020-09-27 DIAGNOSIS — W260XXA Contact with knife, initial encounter: Secondary | ICD-10-CM | POA: Insufficient documentation

## 2020-09-27 DIAGNOSIS — S61412A Laceration without foreign body of left hand, initial encounter: Secondary | ICD-10-CM

## 2020-09-27 DIAGNOSIS — S61422A Laceration with foreign body of left hand, initial encounter: Secondary | ICD-10-CM | POA: Insufficient documentation

## 2020-09-27 DIAGNOSIS — Y93G1 Activity, food preparation and clean up: Secondary | ICD-10-CM | POA: Insufficient documentation

## 2020-09-27 MED ORDER — CEPHALEXIN 500 MG PO CAPS: 500.0000 mg | ORAL_CAPSULE | Freq: Four times a day (QID) | ORAL | 0 refills | Status: DC

## 2020-09-27 MED ORDER — CEPHALEXIN 500 MG PO CAPS: 500.0000 mg | ORAL_CAPSULE | Freq: Four times a day (QID) | ORAL | 0 refills | Status: AC

## 2020-09-27 MED ORDER — TETANUS-DIPHTH-ACELL PERTUSSIS 5-2.5-18.5 LF-MCG/0.5 IM SUSY
0.5000 mL | PREFILLED_SYRINGE | Freq: Once | INTRAMUSCULAR | Status: AC
  Administered 2020-09-27: 0.5 mL via INTRAMUSCULAR
  Filled 2020-09-27: qty 0.5

## 2020-09-27 NOTE — Discharge Instructions (Signed)
Keep the wound cleanwith soap and water. Make sure to pat dry the wound before covering it with any dressing. You can use topical antibiotic ointment and bandage. Ice and elevate for pain relief.   Take antibiotics as directed. Please take all of your antibiotics until finished.  You can take Tylenol or Ibuprofen as directed for pain. You can alternate Tylenol and Ibuprofen every 4 hours for additional pain relief.   Follow up with the referred hand doctor if you continue to have numbness.   Monitor closely for any signs of infection. Return to the Emergency Department for any worsening redness/swelling of the area that begins to spread, drainage from the site, worsening pain, fever or any other worsening or concerning symptoms.

## 2020-09-27 NOTE — ED Notes (Signed)
Patient transported to X-ray 

## 2020-09-27 NOTE — ED Triage Notes (Signed)
Pt reports he injured his left palm with a knife on Friday night. Reports cutting an avocado pit out and his dog jumped on him causing the knife to go into his palm. Good ROM. Bruising noted to palm. Denies pain

## 2020-09-27 NOTE — ED Provider Notes (Signed)
[facility] EMERGENCY DEPARTMENT Provider Note   CSN: [id number] Arrival date & time: 09/27/20  1414     History Chief Complaint  Patient presents with  . Hand Injury    Donald Stevenson is a 30 y.o. male who presents for evaluation of injury noted to left hand.  He reports that about 2 days ago, he was cutting an avocado and states that the knife slipped, causing a puncture wound to the palmar surface of his left hand.  He states that he cleaned the area off.  Over the last 2 days, he has noted some bruising and swelling to the palm.  He states that he started noticing some worsening bruising which made him concerned.  He also reports he has had some scattered numbness to that hand.  He does not know when his last tetanus shot was.  He denies any overlying warmth, erythema, drainage, fevers, weakness.  The history is provided by the patient.       Past Medical History:  Diagnosis Date  . Liver disease     There are no problems to display for this patient.   Past Surgical History:  Procedure Laterality Date  . skin graft surgery         No family history on file.  Social History   Tobacco Use  . Smoking status: Never Smoker  . Smokeless tobacco: Never Used  Vaping Use  . Vaping Use: Never used  Substance Use Topics  . Alcohol use: Never  . Drug use: Yes    Types: Marijuana    Home Medications Prior to Admission medications   Medication Sig Start Date End Date Taking? Authorizing Provider  cephALEXin (KEFLEX) 500 MG capsule Take 1 capsule (500 mg total) by mouth 4 (four) times daily for 7 days. 09/27/20 10/04/20  Maxwell Caul, PA-C  pantoprazole (PROTONIX) 20 MG tablet Take 1 tablet (20 mg total) by mouth daily. 12/12/19   Mare Ferrari, PA-C  pantoprazole (PROTONIX) 20 MG tablet Take 1 tablet (20 mg total) by mouth daily. 03/05/20   Wynetta Fines, MD    Allergies    Soy allergy, Morphine and related, and Phenergan [promethazine]  Review of  Systems   Review of Systems  Constitutional: Negative for fever.  Skin: Positive for wound. Negative for color change.  Neurological: Positive for numbness. Negative for weakness.  All other systems reviewed and are negative.   Physical Exam Updated Vital Signs BP 111/79 (BP Location: Right Arm)   Pulse 92   Temp 98.6 F (37 C) (Oral)   Resp 18   Ht 5\' 6"  (1.676 m)   Wt 56.7 kg   SpO2 99%   BMI 20.18 kg/m   Physical Exam Vitals and nursing note reviewed.  Constitutional:      Appearance: He is well-developed.  HENT:     Head: Normocephalic and atraumatic.  Eyes:     General: No scleral icterus.       Right eye: No discharge.        Left eye: No discharge.     Conjunctiva/sclera: Conjunctivae normal.  Cardiovascular:     Pulses:          Radial pulses are 2+ on the right side and 2+ on the left side.  Pulmonary:     Effort: Pulmonary effort is normal.  Musculoskeletal:     Comments: No bony tenderness noted to the 5 digits of the left upper extremity.  Full flexion/extension both at the  PIP and the DIP joint intact with any difficulty.  No bony tenderness in the wrist, forearm.  Skin:    General: Skin is warm and dry.     Comments: Good distal cap refill. LUE is not dusky in appearance or cool to touch.  Small 1 cm puncture wound to the palmar surface of the left hand, about 2 cm proximal to the LUE index finger with surrounding ecchymosis.  No surrounding warmth, erythema.  No active drainage.  Neurological:     Mental Status: He is alert.     Comments: Reports decreased sensation to the palmar aspect of his left hand as well as the fifth, third, thumb digits of his left hand.  He has normal sensation to the fourth and index finger.  He also reports some decrease sensation to the dorsal surface of his palm near the radial aspect.  When I use a sharp point to sensation.  On digits thumb, third digit, fifth digit, he can feel pressure but cannot feel specific sharpness.     Psychiatric:        Speech: Speech normal.        Behavior: Behavior normal.     ED Results / Procedures / Treatments   Labs (all labs ordered are listed, but only abnormal results are displayed) Labs Reviewed - No data to display  EKG None  Radiology DG Hand Complete Left  Result Date: 09/27/2020 CLINICAL DATA:  Recent puncture wound with new onset left hand numbness, initial encounter EXAM: LEFT HAND - COMPLETE 3+ VIEW COMPARISON:  None. FINDINGS: There is no evidence of fracture or dislocation. There is no evidence of arthropathy or other focal bone abnormality. Soft tissues are unremarkable. IMPRESSION: No acute abnormality noted. Electronically Signed   By: Alcide Clever M.D.   On: 09/27/2020 15:25    Procedures Procedures   Medications Ordered in ED Medications  Tdap (BOOSTRIX) injection 0.5 mL (0.5 mLs Intramuscular Given 09/27/20 1537)    ED Course  I have reviewed the triage vital signs and the nursing notes.  Pertinent labs & imaging results that were available during my care of the patient were reviewed by me and considered in my medical decision making (see chart for details).    MDM Rules/Calculators/A&P                          30 year old male who presents for evaluation of wound noted to the left hand.  Reports 2 days ago, he was cutting an avocado and the knife slipped, causing a injury to the palmar surface of his hand.  Unknown last tetanus.  Reports he noticed some worsening bruising, numbness to the palm. On initial arrival, he is afebrile, toxic appearing.  Vital signs are stable.  On exam, he has equal radial pulses bilaterally.  He has good cap refill, no dusky appearance in the digits.  The wound itself is about 1 cm puncture wound surrounding ecchymosis.  No overlying warmth, erythema.  No active drainage.  He has some decrease sensation distal to the injury but only to the fifth, third and thumb digits of the left hand.  He also reports some numbness to  the dorsal surface towards the radial aspect.  Clinically, this does not follow a specific nerve distribution.  Question if this is more of a shock in her situation but also consider potential nerve deficit from injury.  We will update tetanus, obtain x-ray to ensure there is no  bony abnormality.  X-rays reviewed.  There is no acute bony abnormality.  Will place him on antibiotics given that is on his hand to prevent infection.  Patient is allergic to soy, morphine, Phenergan.  Patient instructed to follow-up with referred hand doctor for further evaluation and management of his numbness.  At this time, he does not have an emergent vascular injury that would need emergent intervention.  We will give him outpatient hand follow up. At this time, patient exhibits no emergent life-threatening condition that require further evaluation in ED. Patient had ample opportunity for questions and discussion. All patient's questions were answered with full understanding. Strict return precautions discussed. Patient expresses understanding and agreement to plan.   Portions of this note were generated with Scientist, clinical (histocompatibility and immunogenetics). Dictation errors may occur despite best attempts at proofreading.  Final Clinical Impression(s) / ED Diagnoses Final diagnoses:  Laceration of left hand, foreign body presence unspecified, initial encounter    Rx / DC Orders ED Discharge Orders         Ordered    cephALEXin (KEFLEX) 500 MG capsule  4 times daily,   Status:  Discontinued        09/27/20 1539    cephALEXin (KEFLEX) 500 MG capsule  4 times daily        09/27/20 1543           Maxwell Caul, PA-C 09/27/20 1714    Charlynne Pander, MD 09/27/20 [phone]

## 2020-09-27 NOTE — ED Notes (Signed)
Pt verbalized understanding to pick up prescriptions at pharmacy listed on d/c instructions.  °

## 2020-09-27 NOTE — ED Notes (Signed)
ED Provider at bedside. 

## 2022-01-09 ENCOUNTER — Emergency Department (HOSPITAL_COMMUNITY)
Admission: EM | Admit: 2022-01-09 | Discharge: 2022-01-09 | Disposition: A | Discharge status: Home / Self Care | Payer: Medicaid Other | Attending: Emergency Medicine | Admitting: Emergency Medicine

## 2022-01-09 ENCOUNTER — Other Ambulatory Visit: Payer: Self-pay

## 2022-01-09 ENCOUNTER — Encounter (HOSPITAL_COMMUNITY): Payer: Self-pay

## 2022-01-09 DIAGNOSIS — E162 Hypoglycemia, unspecified: Secondary | ICD-10-CM

## 2022-01-09 DIAGNOSIS — R1013 Epigastric pain: Secondary | ICD-10-CM | POA: Insufficient documentation

## 2022-01-09 DIAGNOSIS — R059 Cough, unspecified: Secondary | ICD-10-CM | POA: Insufficient documentation

## 2022-01-09 DIAGNOSIS — Z20822 Contact with and (suspected) exposure to covid-19: Secondary | ICD-10-CM | POA: Insufficient documentation

## 2022-01-09 DIAGNOSIS — R7309 Other abnormal glucose: Secondary | ICD-10-CM | POA: Insufficient documentation

## 2022-01-09 DIAGNOSIS — R112 Nausea with vomiting, unspecified: Secondary | ICD-10-CM | POA: Insufficient documentation

## 2022-01-09 LAB — URINALYSIS, ROUTINE W REFLEX MICROSCOPIC
Bacteria, UA: NONE SEEN
Bilirubin Urine: NEGATIVE
Glucose, UA: NEGATIVE mg/dL
Hgb urine dipstick: NEGATIVE
Ketones, ur: 80 mg/dL — AB
Leukocytes,Ua: NEGATIVE
Nitrite: NEGATIVE
Protein, ur: 30 mg/dL — AB
Specific Gravity, Urine: 1.027 (ref 1.005–1.030)
pH: 5 (ref 5.0–8.0)

## 2022-01-09 LAB — CBC WITH DIFFERENTIAL/PLATELET
Abs Immature Granulocytes: 0.03 10*3/uL (ref 0.00–0.07)
Basophils Absolute: 0 10*3/uL (ref 0.0–0.1)
Basophils Relative: 0 %
Eosinophils Absolute: 0.1 10*3/uL (ref 0.0–0.5)
Eosinophils Relative: 1 %
HCT: 50.3 % (ref 39.0–52.0)
Hemoglobin: 16.4 g/dL (ref 13.0–17.0)
Immature Granulocytes: 0 %
Lymphocytes Relative: 19 %
Lymphs Abs: 2 10*3/uL (ref 0.7–4.0)
MCH: 29.7 pg (ref 26.0–34.0)
MCHC: 32.6 g/dL (ref 30.0–36.0)
MCV: 91 fL (ref 80.0–100.0)
Monocytes Absolute: 0.5 10*3/uL (ref 0.1–1.0)
Monocytes Relative: 5 %
Neutro Abs: 8.3 10*3/uL — ABNORMAL HIGH (ref 1.7–7.7)
Neutrophils Relative %: 75 %
Platelets: 373 10*3/uL (ref 150–400)
RBC: 5.53 MIL/uL (ref 4.22–5.81)
RDW: 13.5 % (ref 11.5–15.5)
WBC: 11 10*3/uL — ABNORMAL HIGH (ref 4.0–10.5)
nRBC: 0 % (ref 0.0–0.2)

## 2022-01-09 LAB — COMPREHENSIVE METABOLIC PANEL
ALT: 20 U/L (ref 0–44)
AST: 27 U/L (ref 15–41)
Albumin: 4.9 g/dL (ref 3.5–5.0)
Alkaline Phosphatase: 59 U/L (ref 38–126)
Anion gap: 15 (ref 5–15)
BUN: 26 mg/dL — ABNORMAL HIGH (ref 6–20)
CO2: 21 mmol/L — ABNORMAL LOW (ref 22–32)
Calcium: 10 mg/dL (ref 8.9–10.3)
Chloride: 102 mmol/L (ref 98–111)
Creatinine, Ser: 0.95 mg/dL (ref 0.61–1.24)
GFR, Estimated: >60 mL/min (ref >60)
Glucose, Bld: 63 mg/dL — ABNORMAL LOW (ref 70–99)
Potassium: 4.2 mmol/L (ref 3.5–5.1)
Sodium: 138 mmol/L (ref 135–145)
Total Bilirubin: 1 mg/dL (ref 0.3–1.2)
Total Protein: 9.3 g/dL — ABNORMAL HIGH (ref 6.5–8.1)

## 2022-01-09 LAB — RESP PANEL BY RT-PCR (FLU A&B, COVID) ARPGX2
Influenza A by PCR: NEGATIVE
Influenza B by PCR: NEGATIVE
SARS Coronavirus 2 by RT PCR: NEGATIVE

## 2022-01-09 LAB — CBG MONITORING, ED
Glucose-Capillary: 50 mg/dL — ABNORMAL LOW (ref 70–99)
Glucose-Capillary: 56 mg/dL — ABNORMAL LOW (ref 70–99)
Glucose-Capillary: 122 mg/dL — ABNORMAL HIGH (ref 70–99)

## 2022-01-09 LAB — LIPASE, BLOOD: Lipase: 23 U/L (ref 11–51)

## 2022-01-09 MED ORDER — ONDANSETRON HCL 4 MG/2ML IJ SOLN
4.0000 mg | Freq: Once | INTRAMUSCULAR | Status: AC
  Administered 2022-01-09: 4 mg via INTRAVENOUS
  Filled 2022-01-09: qty 2

## 2022-01-09 MED ORDER — ONDANSETRON 4 MG PO TBDP: ORAL_TABLET | ORAL | 0 refills | Status: AC

## 2022-01-09 MED ORDER — FAMOTIDINE IN NACL 20-0.9 MG/50ML-% IV SOLN
20.0000 mg | Freq: Once | INTRAVENOUS | Status: AC
  Administered 2022-01-09: 20 mg via INTRAVENOUS
  Filled 2022-01-09: qty 50

## 2022-01-09 MED ORDER — SODIUM CHLORIDE 0.9 % IV BOLUS
1000.0000 mL | Freq: Once | INTRAVENOUS | Status: AC
  Administered 2022-01-09: 1000 mL via INTRAVENOUS

## 2022-01-09 NOTE — ED Provider Triage Note (Signed)
Emergency Medicine Provider Triage Evaluation Note  Donald Stevenson , a 31 y.o. male  was evaluated in triage.  Pt complains of n/v since yesterday afternoon. Associated constant epigastric abdominal pain. Described as achy. One episode of diarrhea. No sick contacts. No fevers or chills.   Review of Systems  Positive:  Negative:   Physical Exam  BP 111/70   Pulse 86   Temp 97.8 F (36.6 C)   Resp 19   Ht 5\' 6"  (1.676 m)   Wt 59 kg   SpO2 99%   BMI 20.98 kg/m  Gen:   Awake, no distress   Resp:  Normal effort  MSK:   Moves extremities without difficulty  Other:  +epigastric pain, murphy neg   Medical Decision Making  Medically screening exam initiated at 2:28 PM.  Appropriate orders placed.  Donald Stevenson was informed that the remainder of the evaluation will be completed by another provider, this initial triage assessment does not replace that evaluation, and the importance of remaining in the ED until their evaluation is complete.     Donald Stevenson, Donald Stevenson 01/09/22 1429

## 2022-01-09 NOTE — ED Provider Notes (Signed)
[facility]-EMERGENCY DEPT Provider Note   CSN: [id number] Arrival date & time: 01/09/22  1325     History  Chief Complaint  Patient presents with   Nausea   Emesis   Abdominal Pain   Cough    Donald Stevenson is a 31 y.o. male history of gastritis here presenting with nausea vomiting and cough.  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  He states that he was unable to keep anything down this morning.  Patient has some epigastric pain as well.  She has some chills but denies any fevers.  The history is provided by the patient.       Home Medications Prior to Admission medications   Medication Sig Start Date End Date Taking? Authorizing Provider  pantoprazole (PROTONIX) 20 MG tablet Take 1 tablet (20 mg total) by mouth daily. 12/12/19   Mare Ferrari, PA-C  pantoprazole (PROTONIX) 20 MG tablet Take 1 tablet (20 mg total) by mouth daily. 03/05/20   Wynetta Fines, MD      Allergies    Soy allergy, Morphine and related, and Phenergan [promethazine]    Review of Systems   Review of Systems  Respiratory:  Positive for cough.   Gastrointestinal:  Positive for abdominal pain and vomiting.  All other systems reviewed and are negative.   Physical Exam Updated Vital Signs BP 113/73 (BP Location: Left Arm)   Pulse 68   Temp 97.7 F (36.5 C) (Oral)   Resp 18   Ht 5\' 6"  (1.676 m)   Wt 59 kg   SpO2 100%   BMI 20.98 kg/m  Physical Exam Vitals and nursing note reviewed.  HENT:     Head: Normocephalic.     Mouth/Throat:     Mouth: Mucous membranes are moist.  Eyes:     Extraocular Movements: Extraocular movements intact.     Pupils: Pupils are equal, round, and reactive to light.  Cardiovascular:     Rate and Rhythm: Normal rate and regular rhythm.     Heart sounds: Normal heart sounds.  Pulmonary:     Effort: Pulmonary effort is normal.     Breath sounds: Normal  breath sounds.  Abdominal:     General: Abdomen is flat.     Palpations: Abdomen is soft.  Skin:    General: Skin is warm.     Capillary Refill: Capillary refill takes less than 2 seconds.  Neurological:     General: No focal deficit present.     Mental Status: He is alert and oriented to person, place, and time.  Psychiatric:        Mood and Affect: Mood normal.        Behavior: Behavior normal.     ED Results / Procedures / Treatments   Labs (all labs ordered are listed, but only abnormal results are displayed) Labs Reviewed  RESP PANEL BY RT-PCR (FLU A&B, COVID) ARPGX2  CBC WITH DIFFERENTIAL/PLATELET  COMPREHENSIVE METABOLIC PANEL  LIPASE, BLOOD  URINALYSIS, ROUTINE W REFLEX MICROSCOPIC    EKG None  Radiology No results found.  Procedures Procedures    Medications Ordered in ED Medications  sodium chloride 0.9 % bolus 1,000 mL (has no administration in time range)  ondansetron (ZOFRAN) injection 4 mg (has no administration in time range)  famotidine (PEPCID) IVPB 20 mg premix (has no administration in time range)    ED Course/ Medical Decision  Making/ A&P                           Medical Decision Making Donald Stevenson is a 31 y.o. male here presenting with vomiting.  Likely viral gastroenteritis.  Plan to get CBC and CMP and lipase and hydrate patient and reassess.  6:11 PM I reviewed patient's labs.  Patient's initial labs showed glucose of 50.  Patient was given 2 L bolus and Zofran.  Patient able to tolerate p.o. and glucose up to 122 now.  He is feeling much better.  He is stable for discharge  Problems Addressed: Nausea and vomiting, unspecified vomiting type: acute illness or injury  Amount and/or Complexity of Data Reviewed Labs: ordered. Decision-making details documented in ED Course.  Risk Prescription drug management.    Final Clinical Impression(s) / ED Diagnoses Final diagnoses:  None    Rx / DC Orders ED Discharge Orders      None         Charlynne Pander, MD 01/09/22 [phone]

## 2022-01-09 NOTE — ED Notes (Signed)
Pt A&OX4 ambulatory at d/c with independent steady gait. Pt verbalized understanding of d/c instructions, prescription and follow up care. 

## 2022-01-09 NOTE — ED Notes (Signed)
CBG 50. Pt given two orange juices. Dr. Silverio Lay informed.

## 2022-01-09 NOTE — ED Triage Notes (Signed)
Pt c/o abdominal pain, nausea, vomiting, and a cough since yesterday.

## 2022-01-09 NOTE — Discharge Instructions (Addendum)
Take Zofran for nausea  Stay hydrated   See your doctor for follow up   Return to ER if you have worse abdominal pain, vomiting, dehydration

## 2022-01-09 NOTE — ED Notes (Signed)
Pt given a urinal and informed a urine sample is needed. He was instructed to use call bell when he has been able to void.

## 2022-02-06 ENCOUNTER — Emergency Department (HOSPITAL_BASED_OUTPATIENT_CLINIC_OR_DEPARTMENT_OTHER)
Admission: EM | Admit: 2022-02-06 | Discharge: 2022-02-06 | Disposition: A | Discharge status: Home / Self Care | Payer: Self-pay | Attending: Emergency Medicine | Admitting: Emergency Medicine

## 2022-02-06 ENCOUNTER — Emergency Department (HOSPITAL_BASED_OUTPATIENT_CLINIC_OR_DEPARTMENT_OTHER): Payer: Self-pay | Admitting: Radiology

## 2022-02-06 ENCOUNTER — Encounter (HOSPITAL_BASED_OUTPATIENT_CLINIC_OR_DEPARTMENT_OTHER): Payer: Self-pay | Admitting: Emergency Medicine

## 2022-02-06 ENCOUNTER — Emergency Department (HOSPITAL_BASED_OUTPATIENT_CLINIC_OR_DEPARTMENT_OTHER): Payer: Self-pay

## 2022-02-06 DIAGNOSIS — T189XXA Foreign body of alimentary tract, part unspecified, initial encounter: Secondary | ICD-10-CM | POA: Insufficient documentation

## 2022-02-06 DIAGNOSIS — X58XXXA Exposure to other specified factors, initial encounter: Secondary | ICD-10-CM | POA: Insufficient documentation

## 2022-02-06 NOTE — ED Provider Notes (Signed)
[facility] EMERGENCY DEPT Provider Note  CSN: [id number] Arrival date & time: 02/06/22 [id number]  Chief Complaint(s) Swallowed Foreign Body  HPI Donald Stevenson is a 31 y.o. male     Swallowed Foreign Body This is a new problem. The current episode started less than 1 hour ago. Pertinent negatives include no chest pain, no abdominal pain, no headaches and no shortness of breath. Nothing aggravates the symptoms. Nothing relieves the symptoms.   Patient reports that he was eating a corn dog and believes he swallowed the wooden stick.  Past Medical History Past Medical History:  Diagnosis Date   Liver disease    There are no problems to display for this patient.  Home Medication(s) Prior to Admission medications   Medication Sig Start Date End Date Taking? Authorizing Provider  ondansetron (ZOFRAN-ODT) 4 MG disintegrating tablet 4mg  ODT q4 hours prn nausea/vomit 01/09/22   Drenda Freeze, MD  pantoprazole (PROTONIX) 20 MG tablet Take 1 tablet (20 mg total) by mouth daily. 12/12/19   Garald Balding, PA-C  pantoprazole (PROTONIX) 20 MG tablet Take 1 tablet (20 mg total) by mouth daily. 03/05/20   Valarie Merino, MD                                                                                                                                    Allergies Soy allergy, Morphine and related, and Phenergan [promethazine]  Review of Systems Review of Systems  Respiratory:  Negative for shortness of breath.   Cardiovascular:  Negative for chest pain.  Gastrointestinal:  Negative for abdominal pain.  Neurological:  Negative for headaches.   As noted in HPI  Physical Exam Vital Signs  I have reviewed the triage vital signs BP 117/87   Pulse 98   Temp 98.3 F (36.8 C) (Oral)   Resp 18   SpO2 95%   Physical Exam Vitals reviewed.  Constitutional:      General: He is not in acute distress.    Appearance: He is well-developed. He is not diaphoretic.  HENT:      Head: Normocephalic and atraumatic.     Right Ear: External ear normal.     Left Ear: External ear normal.     Nose: Nose normal.     Mouth/Throat:     Mouth: Mucous membranes are moist. No injury or lacerations.     Tongue: No lesions.     Palate: No lesions.     Pharynx: No posterior oropharyngeal erythema.     Tonsils: No tonsillar exudate or tonsillar abscesses.  Eyes:     General: No scleral icterus.    Conjunctiva/sclera: Conjunctivae normal.  Neck:     Trachea: Phonation normal.  Cardiovascular:     Rate and Rhythm: Normal rate and regular rhythm.  Pulmonary:     Effort: Pulmonary effort is normal. No respiratory distress.     Breath sounds: No stridor.  Abdominal:  General: There is no distension.  Musculoskeletal:        General: Normal range of motion.     Cervical back: Normal range of motion.  Neurological:     Mental Status: He is alert and oriented to person, place, and time.  Psychiatric:        Behavior: Behavior normal.     ED Results and Treatments Labs (all labs ordered are listed, but only abnormal results are displayed) Labs Reviewed - No data to display                                                                                                                       EKG  EKG Interpretation  Date/Time:    Ventricular Rate:    PR Interval:    QRS Duration:   QT Interval:    QTC Calculation:   R Axis:     Text Interpretation:         Radiology CT Chest Wo Contrast  Result Date: 02/06/2022 CLINICAL DATA:  Possible ingestion of wood foreign body. EXAM: CT CHEST WITHOUT CONTRAST TECHNIQUE: Multidetector CT imaging of the chest was performed following the standard protocol without IV contrast. RADIATION DOSE REDUCTION: This exam was performed according to the departmental dose-optimization program which includes automated exposure control, adjustment of the mA and/or kV according to patient size and/or use of iterative reconstruction  technique. COMPARISON:  02/06/2022. FINDINGS: Cardiovascular: No significant vascular findings. Normal heart size. No pericardial effusion. Mediastinum/Nodes: No enlarged mediastinal or axillary lymph nodes. Thyroid gland, trachea, and esophagus demonstrate no significant findings. No radiopaque foreign body is identified. Lungs/Pleura: No consolidation, effusion, or pneumothorax. Scattered pulmonary nodules are noted bilaterally. The largest nodule in the right lower lobe measures 7 mm, axial image 142. The largest nodule in the left lower lobe measures 5 mm, axial image 93. There are ground-glass opacities with focal bronchiectasis in the right lower lobe measuring up to 1 cm. Upper Abdomen: Ingested debris is present in the stomach. No radiopaque foreign body. Musculoskeletal: No chest wall mass or suspicious bone lesions identified. IMPRESSION: 1. No evidence of radiopaque foreign body. 2. Multiple pulmonary nodules bilaterally measuring up to 7 mm. Non-contrast chest CT at 3-6 months is recommended. If the nodules are stable at time of repeat CT, then future CT at 18-24 months (from today's scan) is considered optional for low-risk patients, but is recommended for high-risk patients. This recommendation follows the consensus statement: Guidelines for Management of Incidental Pulmonary Nodules Detected on CT Images: From the Fleischner Society 2017; Radiology 2017; 284:228-243. Electronically Signed   By: Brett Fairy M.D.   On: 02/06/2022 04:24   DG Chest 2 View  Result Date: 02/06/2022 CLINICAL DATA:  Swallowed foreign body. EXAM: CHEST - 2 VIEW COMPARISON:  Chest x-ray 07/03/2020 FINDINGS: No foreign body identified. The heart size and mediastinal contours are within normal limits. Both lungs are clear. The visualized skeletal structures are unremarkable. IMPRESSION: No active cardiopulmonary disease. Electronically Signed  By: Ronney Asters M.D.   On: 02/06/2022 03:16   DG Neck Soft Tissue  Result  Date: 02/06/2022 CLINICAL DATA:  Swallowed foreign body. EXAM: NECK SOFT TISSUES - 1+ VIEW COMPARISON:  None Available. FINDINGS: There is no evidence of retropharyngeal soft tissue swelling or epiglottic enlargement. The cervical airway is unremarkable and no radio-opaque foreign body identified. IMPRESSION: No radiopaque foreign body identified. Electronically Signed   By: Ronney Asters M.D.   On: 02/06/2022 03:13    Medications Ordered in ED Medications - No data to display                                                                                                                                   Procedures Procedures  (including critical care time)  Medical Decision Making / ED Course   Medical Decision Making Amount and/or Complexity of Data Reviewed Radiology: ordered and independent interpretation performed. Decision-making details documented in ED Course.    Patient concerned for possible swallowed wood FB. Imaging w/o obvious FB. No airway compromise. Tolerating PO       Final Clinical Impression(s) / ED Diagnoses Final diagnoses:  Foreign body, swallowed, initial encounter   The patient appears reasonably screened and/or stabilized for discharge and I doubt any other medical condition or other [facility] requiring further screening, evaluation, or treatment in the ED at this time. I have discussed the findings, Dx and Tx plan with the patient/family who expressed understanding and agree(s) with the plan. Discharge instructions discussed at length. The patient/family was given strict return precautions who verbalized understanding of the instructions. No further questions at time of discharge.  Disposition: Discharge  Condition: Good  ED Discharge Orders     None        Follow Up: Primary care provider  Call  as needed           This chart was dictated using voice recognition software.  Despite best efforts to proofread,  errors can occur which can  change the documentation meaning.    Fatima Blank, MD 02/06/22 [phone]

## 2022-02-06 NOTE — ED Notes (Signed)
ED Provider at bedside. 

## 2022-02-06 NOTE — ED Triage Notes (Addendum)
Pt presents to ED POV. Pt reports he was eating corn dog when he noted half the stick gone. Pt reports he can feel stick in throat. Pt reports heavy marijuana use this evening. Airway intact.

## 2022-06-15 ENCOUNTER — Emergency Department (HOSPITAL_COMMUNITY): Payer: Self-pay

## 2022-06-15 ENCOUNTER — Emergency Department (HOSPITAL_COMMUNITY)
Admission: EM | Admit: 2022-06-15 | Discharge: 2022-06-16 | Disposition: A | Discharge status: Home / Self Care | Payer: Self-pay | Attending: Student | Admitting: Student

## 2022-06-15 ENCOUNTER — Encounter (HOSPITAL_COMMUNITY): Payer: Self-pay

## 2022-06-15 ENCOUNTER — Other Ambulatory Visit: Payer: Self-pay

## 2022-06-15 DIAGNOSIS — R000 Tachycardia, unspecified: Secondary | ICD-10-CM | POA: Insufficient documentation

## 2022-06-15 DIAGNOSIS — Z1152 Encounter for screening for COVID-19: Secondary | ICD-10-CM | POA: Insufficient documentation

## 2022-06-15 DIAGNOSIS — R1013 Epigastric pain: Secondary | ICD-10-CM | POA: Insufficient documentation

## 2022-06-15 DIAGNOSIS — R112 Nausea with vomiting, unspecified: Secondary | ICD-10-CM | POA: Insufficient documentation

## 2022-06-15 LAB — CBC WITH DIFFERENTIAL/PLATELET
Abs Immature Granulocytes: 0.03 10*3/uL (ref 0.00–0.07)
Basophils Absolute: 0 10*3/uL (ref 0.0–0.1)
Basophils Relative: 0 %
Eosinophils Absolute: 0 10*3/uL (ref 0.0–0.5)
Eosinophils Relative: 0 %
HCT: 49.9 % (ref 39.0–52.0)
Hemoglobin: 16.3 g/dL (ref 13.0–17.0)
Immature Granulocytes: 0 %
Lymphocytes Relative: 32 %
Lymphs Abs: 3.6 10*3/uL (ref 0.7–4.0)
MCH: 29.4 pg (ref 26.0–34.0)
MCHC: 32.7 g/dL (ref 30.0–36.0)
MCV: 90.1 fL (ref 80.0–100.0)
Monocytes Absolute: 1.2 10*3/uL — ABNORMAL HIGH (ref 0.1–1.0)
Monocytes Relative: 11 %
Neutro Abs: 6.4 10*3/uL (ref 1.7–7.7)
Neutrophils Relative %: 57 %
Platelets: 436 10*3/uL — ABNORMAL HIGH (ref 150–400)
RBC: 5.54 MIL/uL (ref 4.22–5.81)
RDW: 13.9 % (ref 11.5–15.5)
WBC: 11.4 10*3/uL — ABNORMAL HIGH (ref 4.0–10.5)
nRBC: 0 % (ref 0.0–0.2)

## 2022-06-15 LAB — URINALYSIS, ROUTINE W REFLEX MICROSCOPIC
Bacteria, UA: NONE SEEN
Bilirubin Urine: NEGATIVE
Glucose, UA: NEGATIVE mg/dL
Ketones, ur: 80 mg/dL — AB
Nitrite: NEGATIVE
Protein, ur: 30 mg/dL — AB
Specific Gravity, Urine: >1.046 — ABNORMAL HIGH (ref 1.005–1.030)
pH: 5 (ref 5.0–8.0)

## 2022-06-15 LAB — COMPREHENSIVE METABOLIC PANEL
ALT: 17 U/L (ref 0–44)
AST: 29 U/L (ref 15–41)
Albumin: 4.7 g/dL (ref 3.5–5.0)
Alkaline Phosphatase: 61 U/L (ref 38–126)
Anion gap: 20 — ABNORMAL HIGH (ref 5–15)
BUN: 25 mg/dL — ABNORMAL HIGH (ref 6–20)
CO2: 13 mmol/L — ABNORMAL LOW (ref 22–32)
Calcium: 9.6 mg/dL (ref 8.9–10.3)
Chloride: 101 mmol/L (ref 98–111)
Creatinine, Ser: 1.26 mg/dL — ABNORMAL HIGH (ref 0.61–1.24)
GFR, Estimated: >60 mL/min (ref >60)
Glucose, Bld: 66 mg/dL — ABNORMAL LOW (ref 70–99)
Potassium: 4.7 mmol/L (ref 3.5–5.1)
Sodium: 134 mmol/L — ABNORMAL LOW (ref 135–145)
Total Bilirubin: 1.5 mg/dL — ABNORMAL HIGH (ref 0.3–1.2)
Total Protein: 9.4 g/dL — ABNORMAL HIGH (ref 6.5–8.1)

## 2022-06-15 LAB — RESP PANEL BY RT-PCR (RSV, FLU A&B, COVID)  RVPGX2
Influenza A by PCR: NEGATIVE
Influenza B by PCR: NEGATIVE
Resp Syncytial Virus by PCR: NEGATIVE
SARS Coronavirus 2 by RT PCR: NEGATIVE

## 2022-06-15 LAB — LIPASE, BLOOD: Lipase: 28 U/L (ref 11–51)

## 2022-06-15 IMAGING — DX DG SHOULDER 2+V*L*
3 series · 3 of 3 positions shown · non-contrast
Comparison: None.

CLINICAL DATA: Fall off [place] with left shoulder pain.

EXAM:
LEFT SHOULDER - 2+ VIEW

[shoulder grashey]
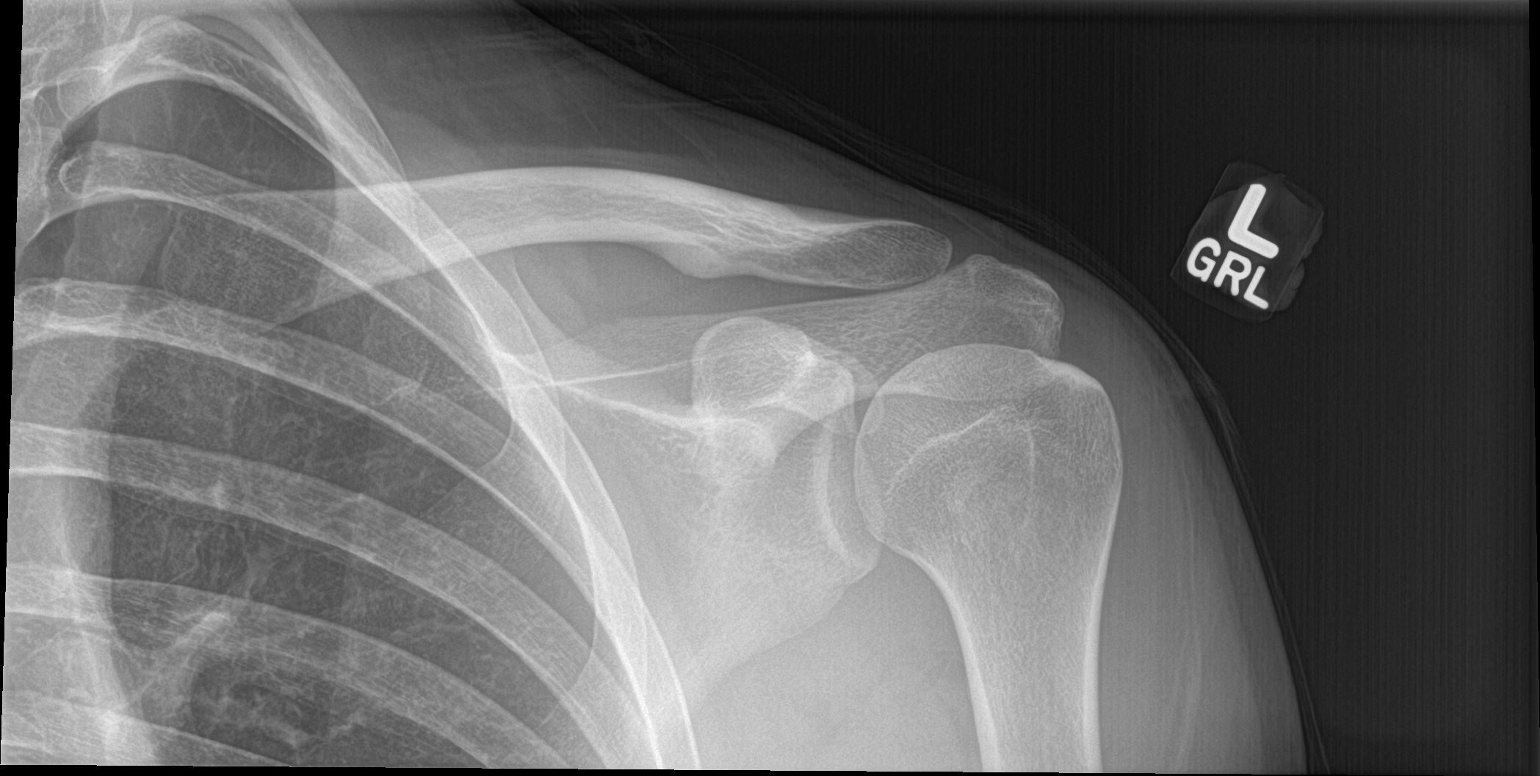

[shoulder y view]
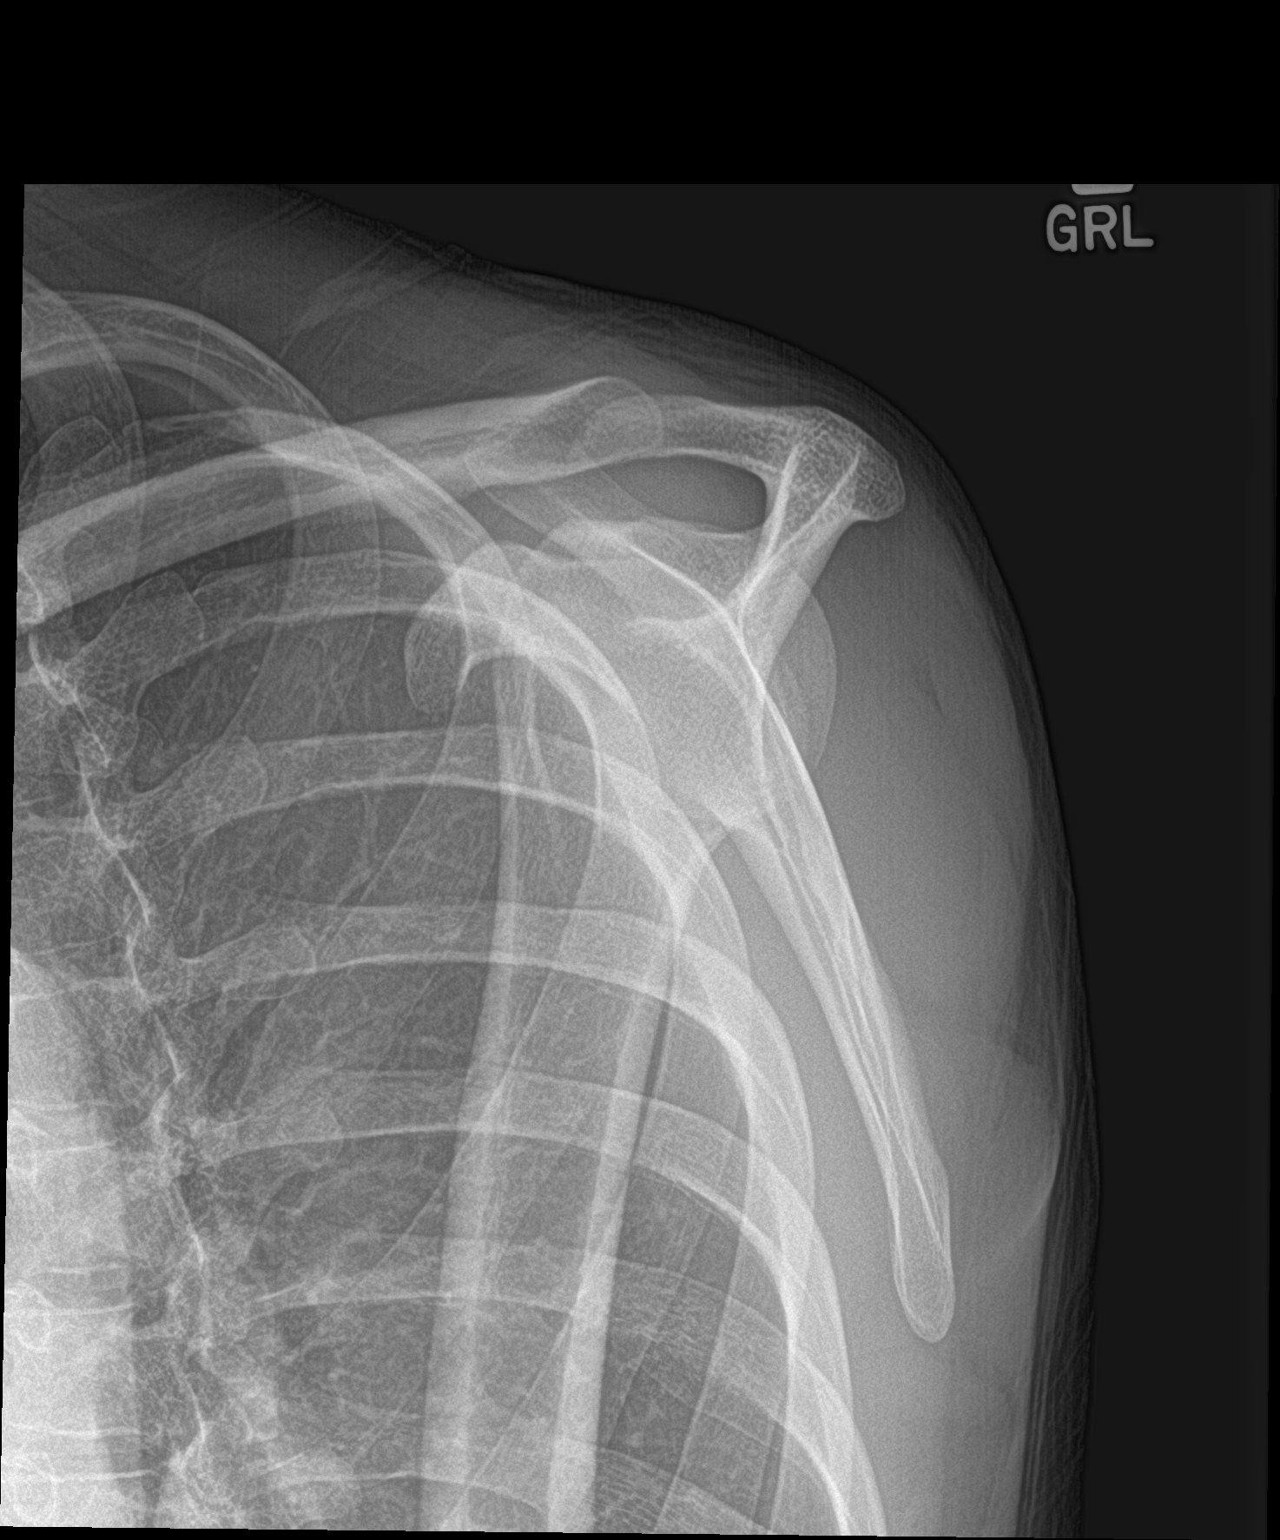

[shoulder axillary]
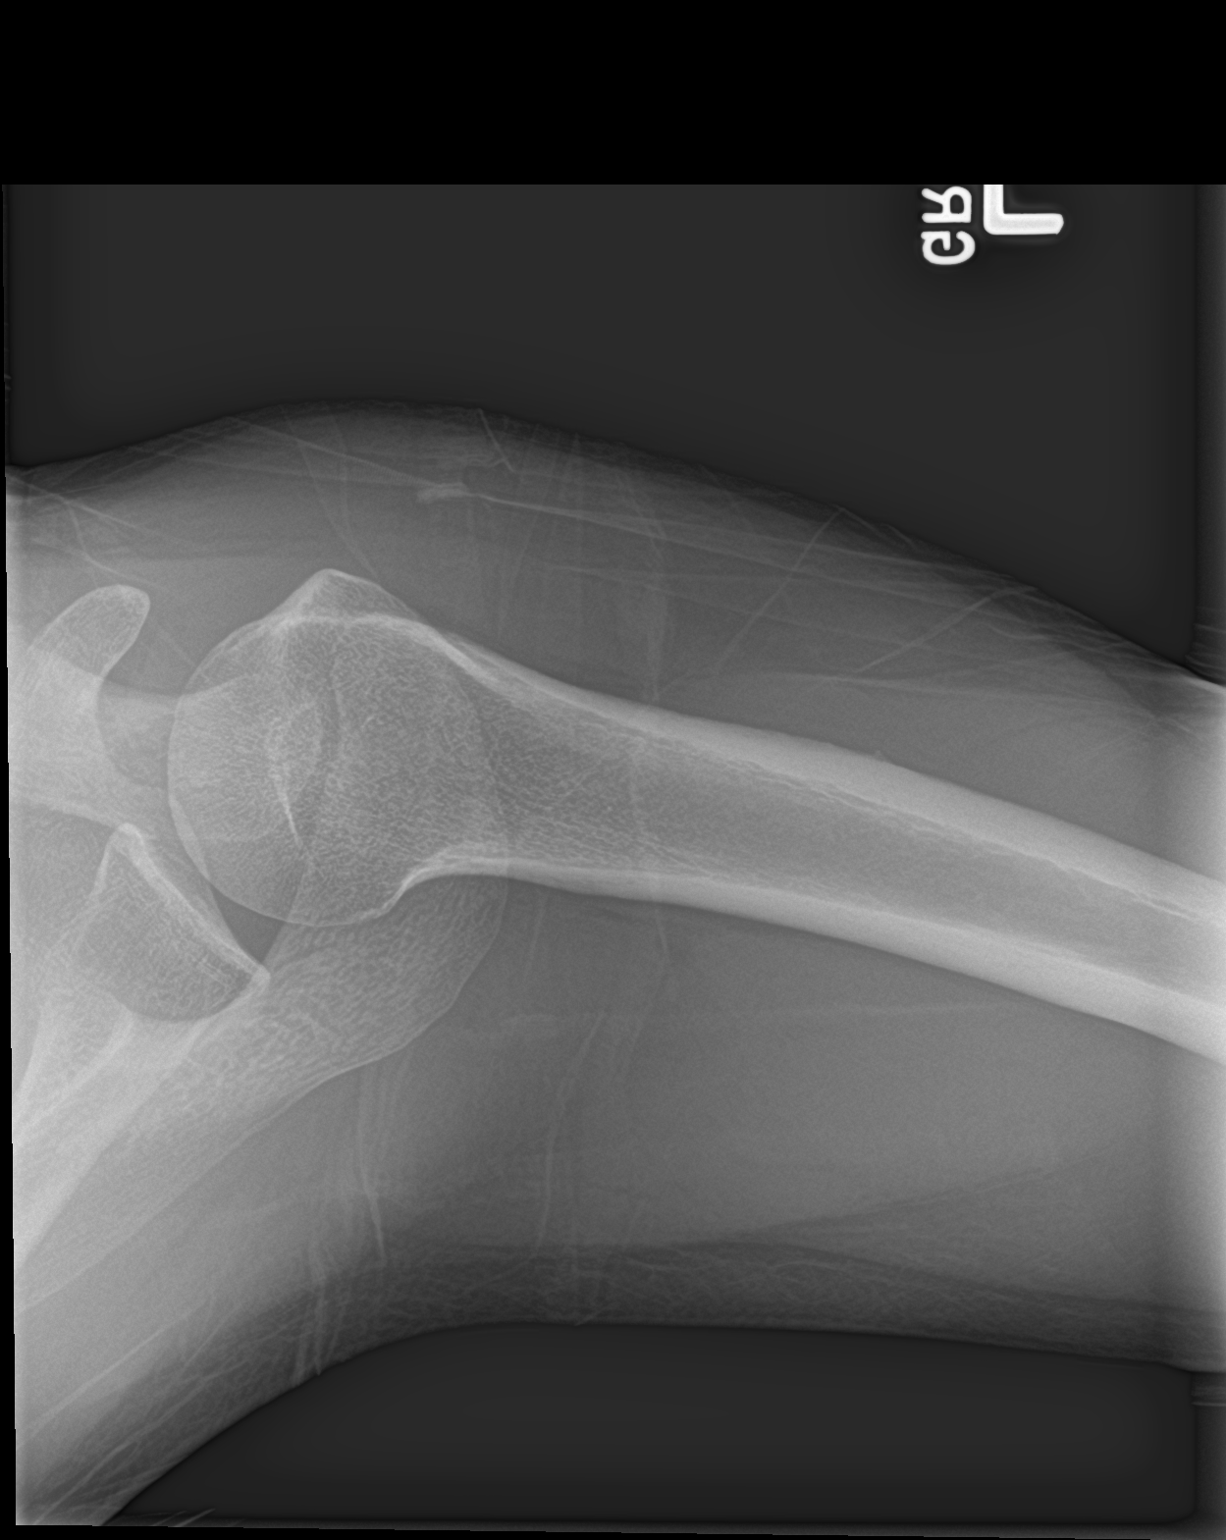

[3 of 3 positions shown; findings below may reference images not displayed]

FINDINGS: There is no evidence of fracture or dislocation. There is no
evidence of arthropathy or other focal bone abnormality. Soft
tissues are unremarkable. No fracture of included left ribs.
IMPRESSION: Negative radiographs of the left shoulder.

## 2022-06-15 IMAGING — CT CT CERVICAL SPINE W/O CM
3 of 4 series · 11 of 33 positions shown, 13 images · non-contrast
Comparison: None.

CLINICAL DATA: Laceration and hematoma to left forehead fell down a
hill

EXAM:
CT HEAD WITHOUT CONTRAST
CT CERVICAL SPINE WITHOUT CONTRAST
TECHNIQUE: Multidetector CT imaging of the head and cervical spine was
performed following the standard protocol without intravenous
contrast. Multiplanar CT image reconstructions of the cervical spine
were also generated.

[Series 4: sagittals · sagittal · 0.24mm/px · 5 of 61 slices shown, 6 images]
[im 21/61  bone]
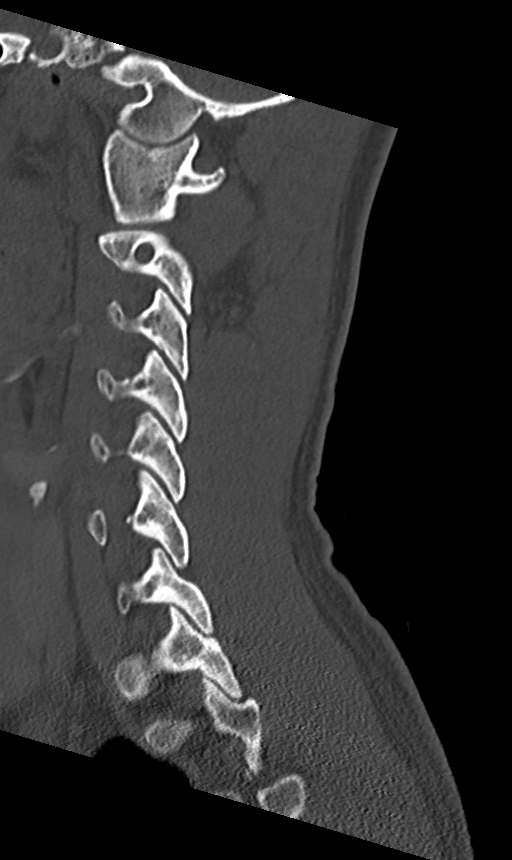
[im 26/61  bone]
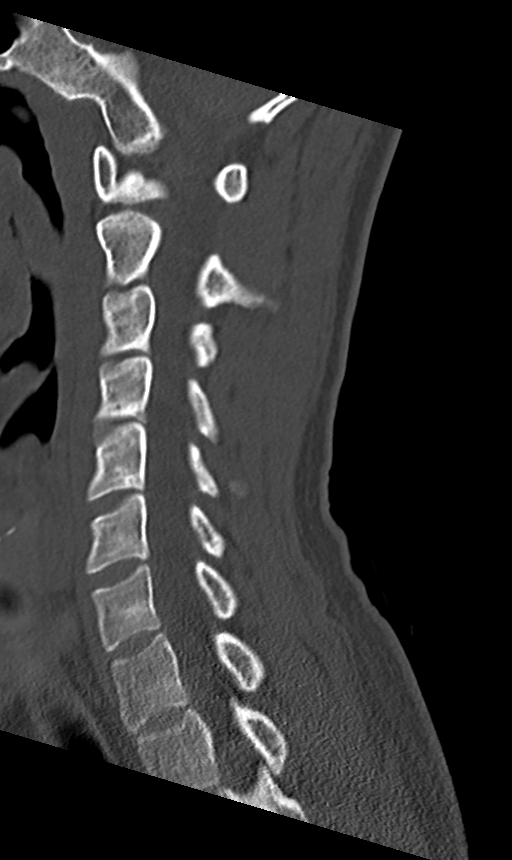
[im 31/61  soft-tissue]
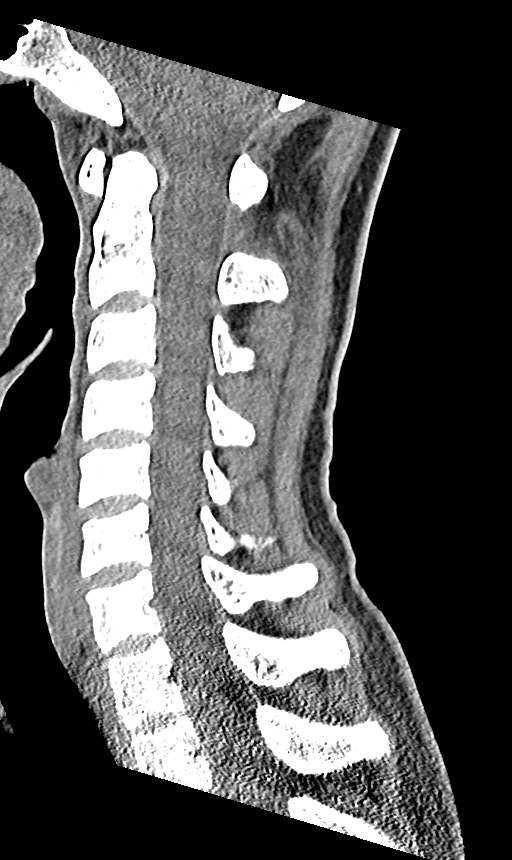
[im 31/61  bone]
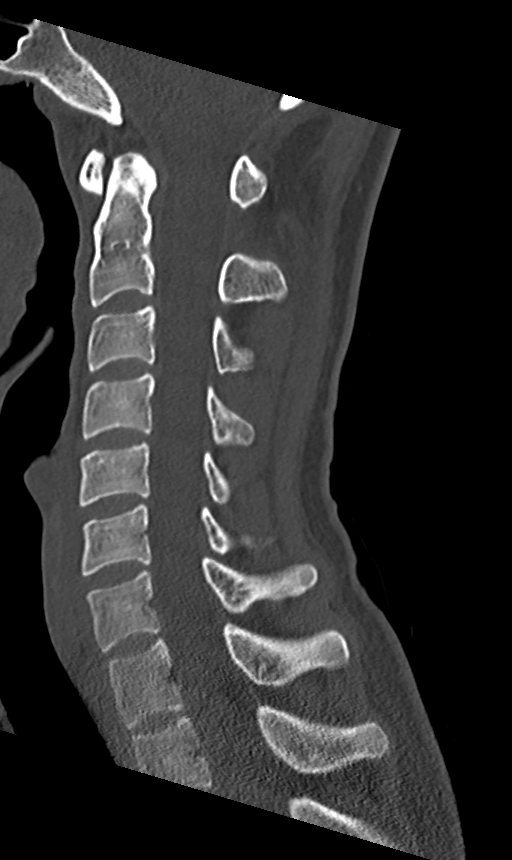
[im 36/61  bone]
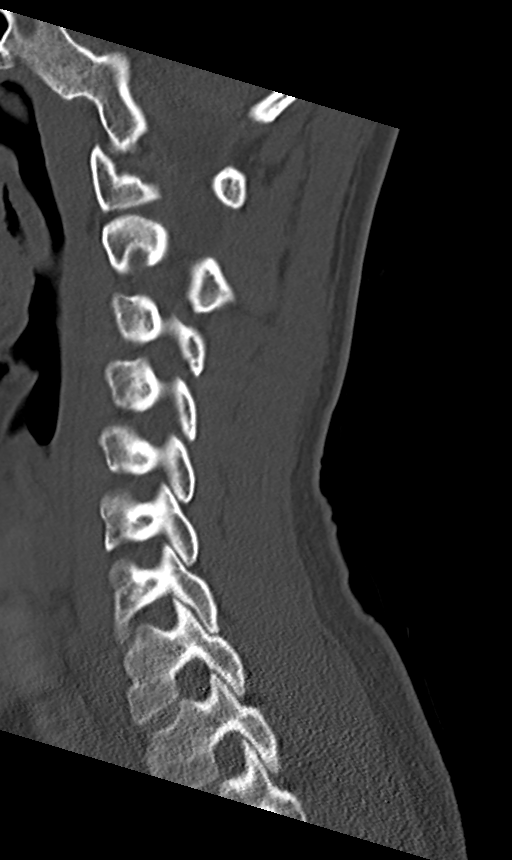
[im 41/61  bone]
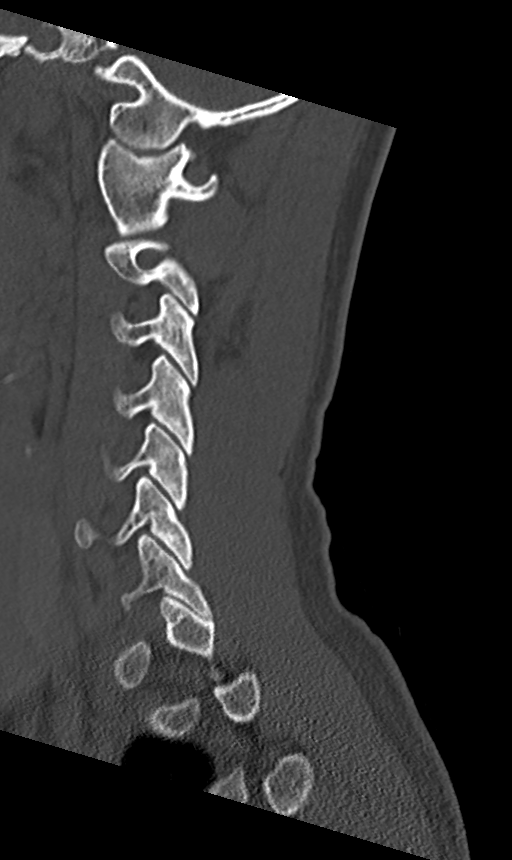

[Series 5: coronals · coronal · 0.23mm/px · 3 of 63 slices shown]
[im 13/63  bone]
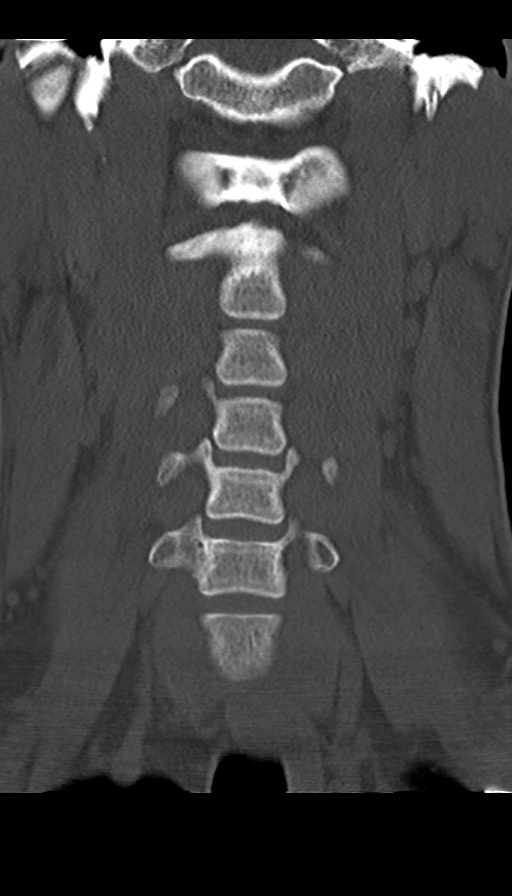
[im 25/63  bone]
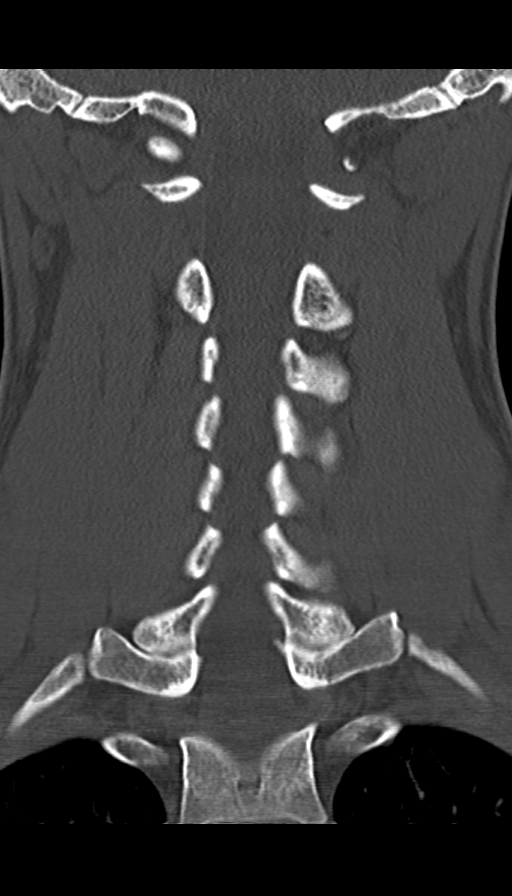
[im 38/63  bone]
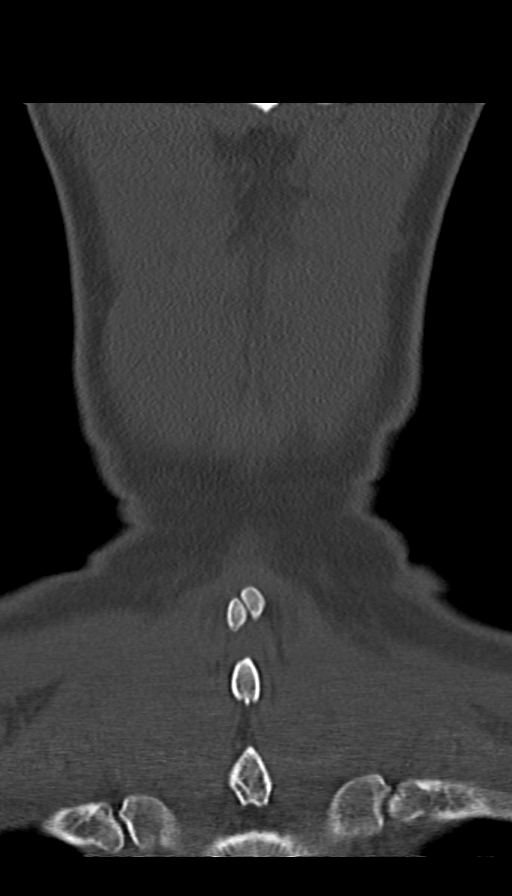

[Series 6: orthogonals · axial · 0.23mm/px · z∈[+1115,+1230]mm · 3 of 105 slices shown, 4 images]
[im 30/105  soft-tissue]
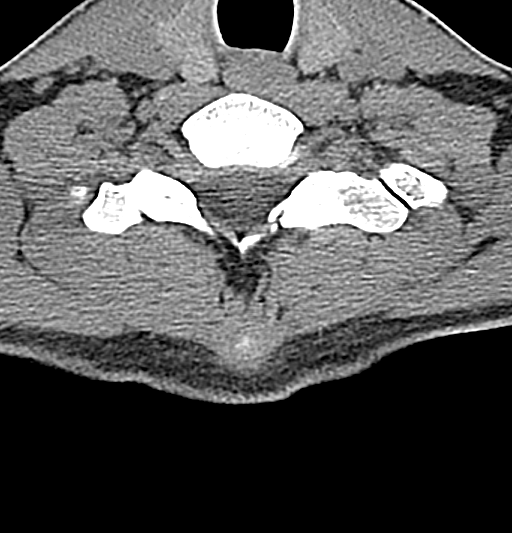
[im 30/105  bone]
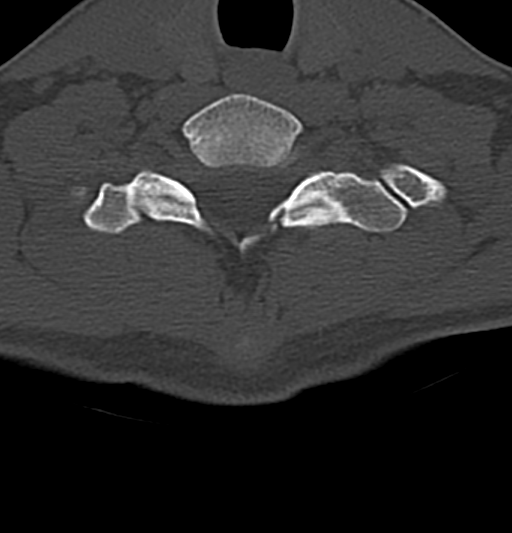
[im 60/105  bone]
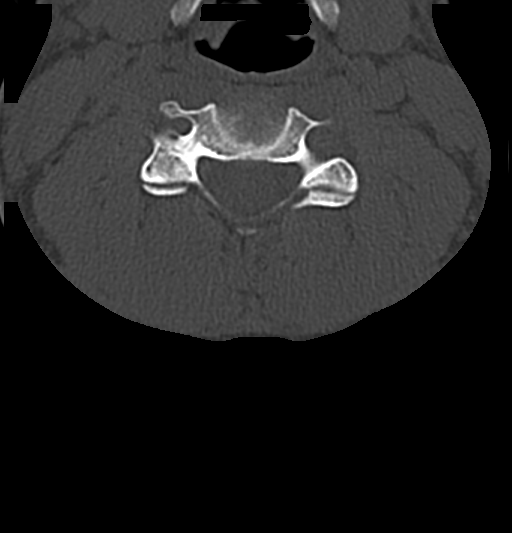
[im 90/105  bone]
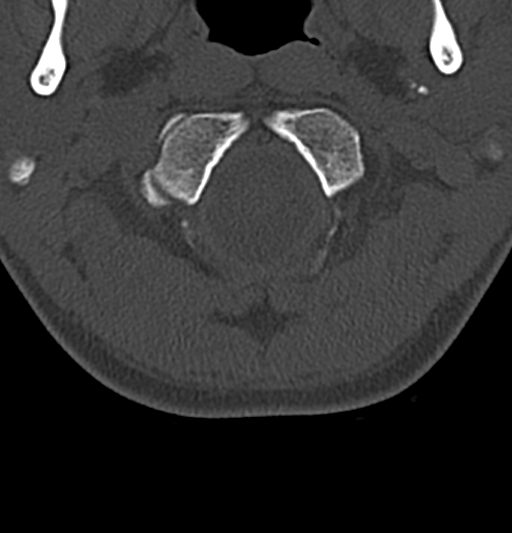

[11 of 33 positions shown; findings below may reference images not displayed]

FINDINGS: CT HEAD FINDINGS

Brain: No evidence of acute infarction, hemorrhage, hydrocephalus,
extra-axial collection or mass lesion/mass effect.

Vascular: No hyperdense vessel or unexpected calcification.

Skull: Normal. Negative for fracture or focal lesion.

Sinuses/Orbits: No acute finding.

Other: None

CT CERVICAL SPINE FINDINGS

Alignment: Normal.

Skull base and vertebrae: No acute fracture. No primary bone lesion
or focal pathologic process.

Soft tissues and spinal canal: No prevertebral fluid or swelling. No
visible canal hematoma.

Disc levels:  Within normal limits

Upper chest: Negative.

Other: None
IMPRESSION: 1. Negative non contrasted CT appearance of the brain.
2. No acute osseous abnormality of the cervical spine.

## 2022-06-15 MED ORDER — SODIUM CHLORIDE 0.9 % IV BOLUS
1000.0000 mL | Freq: Once | INTRAVENOUS | Status: AC
  Administered 2022-06-15: 1000 mL via INTRAVENOUS

## 2022-06-15 MED ORDER — IOHEXOL 300 MG/ML  SOLN
100.0000 mL | Freq: Once | INTRAMUSCULAR | Status: AC | PRN
  Administered 2022-06-15: 80 mL via INTRAVENOUS

## 2022-06-15 MED ORDER — ONDANSETRON HCL 4 MG/2ML IJ SOLN
4.0000 mg | Freq: Once | INTRAMUSCULAR | Status: AC
  Administered 2022-06-15: 4 mg via INTRAVENOUS
  Filled 2022-06-15: qty 2

## 2022-06-15 NOTE — ED Notes (Signed)
At approval of triage RN, pt given water for PO challenge as he states he is no longer nauseated.

## 2022-06-15 NOTE — ED Provider Triage Note (Signed)
Emergency Medicine Provider Triage Evaluation Note  Donald Stevenson , a 32 y.o. male  was evaluated in triage.  Pt complains of abdominal pain, nausea, and vomiting x3 days. Admits to marijuana use. History of PUD. Patient had syncopal episode in lobby.   Review of Systems  Positive: N/V, abdominal pain Negative: CP  Physical Exam  BP 104/86 (BP Location: Left Arm)   Pulse (!) 128   Temp 97.9 F (36.6 C) (Oral)   Resp 18   SpO2 100%  Gen:   Awake, no distress   Resp:  Normal effort  MSK:   Moves extremities without difficulty  Other:    Medical Decision Making  Medically screening exam initiated at 7:24 PM.  Appropriate orders placed.  Donald Stevenson was informed that the remainder of the evaluation will be completed by another provider, this initial triage assessment does not replace that evaluation, and the importance of remaining in the ED until their evaluation is complete.  Labs CT abdomen Syncope in lobby, likely from dehydration from vomiting Zofran given in triage   Donald Bouchard, PA-C 06/15/22 1925

## 2022-06-15 NOTE — ED Triage Notes (Signed)
Pt arrives via POV. Pt reports abdominal pain, nausea, and vomiting for the past three days. This RN was informed by registration staff that the patient fell into the floor. This RN, Chrys Racer, [place], and Horris Latino EMT went out to the lobby and patient was laying in the floor. Pt was assisted to a wheelchair. Pt states he was about to vomit in the trash can and fell on the floor. Pt is AxOx4. Does have some redness and a hematoma to back of the head.

## 2022-06-15 NOTE — ED Notes (Addendum)
Pt asking if he can have orange juice. Advised pt that due to his vomiting, we couldn't give him anything by mouth.  Pt is ambulatory to restroom w/out assistance. Requested pt provide urine sample.

## 2022-06-16 MED ORDER — OMEPRAZOLE 20 MG PO CPDR: 20.0000 mg | DELAYED_RELEASE_CAPSULE | Freq: Every day | ORAL | 1 refills | Status: AC

## 2022-06-16 MED ORDER — ONDANSETRON HCL 4 MG PO TABS: 4.0000 mg | ORAL_TABLET | Freq: Four times a day (QID) | ORAL | 0 refills | Status: AC

## 2022-06-16 NOTE — ED Notes (Signed)
Pt advises this Probation officer that he needs to leave, otherwise he will not have a ride anywhere until mid morning. PA notified and spoke with pt about d/c.

## 2022-06-16 NOTE — ED Provider Notes (Signed)
EMERGENCY DEPARTMENT AT [facility] Provider Note   CSN: [id number] Arrival date & time: 06/15/22  1820     History  Chief Complaint  Patient presents with   Abdominal Pain   Nausea   Vomiting    Donald Stevenson is a 32 y.o. male with overall noncontributory past medical history presents with concern for abdominal pain, nausea, vomiting for the last 3 days.  Patient had a single episode in the emergency department secondary to likely dehydration, he denies any chest pain, shortness of breath, he does not take a blood thinner.  He does have some redness and a small hematoma on the back of the head.  He denies any hematemesis, hematochezia, significant diarrhea, constipation.  Patient does endorse that he was exposed to the flu someone in his household, but denies significant fever, chills.  Does report history of acid reflux in the past, reports that he is not currently taking any medicine for it.  He has been previously prescribed Protonix most recently in 2021 in the emergency department.   Abdominal Pain      Home Medications Prior to Admission medications   Medication Sig Start Date End Date Taking? Authorizing Provider  omeprazole (PRILOSEC) 20 MG capsule Take 1 capsule (20 mg total) by mouth daily. 06/16/22  Yes Michelyn Scullin H, PA-C  ondansetron (ZOFRAN) 4 MG tablet Take 1 tablet (4 mg total) by mouth every 6 (six) hours. 06/16/22  Yes Horald Birky H, PA-C  ondansetron (ZOFRAN-ODT) 4 MG disintegrating tablet 4mg  ODT q4 hours prn nausea/vomit 01/09/22   01/11/22, MD  pantoprazole (PROTONIX) 20 MG tablet Take 1 tablet (20 mg total) by mouth daily. 12/12/19   12/14/19, PA-C  pantoprazole (PROTONIX) 20 MG tablet Take 1 tablet (20 mg total) by mouth daily. 03/05/20   03/07/20, MD      Allergies    Soy allergy, Morphine and related, and Phenergan [promethazine]    Review of Systems   Review of Systems  Gastrointestinal:   Positive for abdominal pain.  All other systems reviewed and are negative.   Physical Exam Updated Vital Signs BP (!) 130/99 (BP Location: Right Arm)   Pulse 76   Temp 97.7 F (36.5 C) (Oral)   Resp 18   SpO2 100%  Physical Exam Vitals and nursing note reviewed.  Constitutional:      General: He is not in acute distress.    Appearance: Normal appearance.  HENT:     Head: Normocephalic and atraumatic.  Eyes:     General:        Right eye: No discharge.        Left eye: No discharge.  Cardiovascular:     Rate and Rhythm: Regular rhythm. Tachycardia present.     Heart sounds: No murmur heard.    No friction rub. No gallop.     Comments: Normal rhythm, no significant tenderness palpation of the chest wall Pulmonary:     Effort: Pulmonary effort is normal.     Breath sounds: Normal breath sounds.  Abdominal:     General: Bowel sounds are normal.     Palpations: Abdomen is soft.     Comments: Mild tenderness palpation epigastric region, no rebound, rigidity, guarding throughout  Skin:    General: Skin is warm and dry.     Capillary Refill: Capillary refill takes less than 2 seconds.  Neurological:     Mental Status: He is alert and oriented to  person, place, and time.  Psychiatric:        Mood and Affect: Mood normal.        Behavior: Behavior normal.     ED Results / Procedures / Treatments   Labs (all labs ordered are listed, but only abnormal results are displayed) Labs Reviewed  CBC WITH DIFFERENTIAL/PLATELET - Abnormal; Notable for the following components:      Result Value   WBC 11.4 (*)    Platelets 436 (*)    Monocytes Absolute 1.2 (*)    All other components within normal limits  COMPREHENSIVE METABOLIC PANEL - Abnormal; Notable for the following components:   Sodium 134 (*)    CO2 13 (*)    Glucose, Bld 66 (*)    BUN 25 (*)    Creatinine, Ser 1.26 (*)    Total Protein 9.4 (*)    Total Bilirubin 1.5 (*)    Anion gap 20 (*)    All other components  within normal limits  URINALYSIS, ROUTINE W REFLEX MICROSCOPIC - Abnormal; Notable for the following components:   Specific Gravity, Urine >1.046 (*)    Hgb urine dipstick SMALL (*)    Ketones, ur 80 (*)    Protein, ur 30 (*)    Leukocytes,Ua TRACE (*)    All other components within normal limits  RESP PANEL BY RT-PCR (RSV, FLU A&B, COVID)  RVPGX2  LIPASE, BLOOD    EKG None  Radiology CT ABDOMEN PELVIS W CONTRAST  Result Date: 06/15/2022 CLINICAL DATA:  Epigastric pain EXAM: CT ABDOMEN AND PELVIS WITH CONTRAST TECHNIQUE: Multidetector CT imaging of the abdomen and pelvis was performed using the standard protocol following bolus administration of intravenous contrast. RADIATION DOSE REDUCTION: This exam was performed according to the departmental dose-optimization program which includes automated exposure control, adjustment of the mA and/or kV according to patient size and/or use of iterative reconstruction technique. CONTRAST:  87mL OMNIPAQUE IOHEXOL 300 MG/ML  SOLN COMPARISON:  None Available. FINDINGS: Lower chest: No acute abnormality. Hepatobiliary: No focal liver abnormality is seen. No gallstones, gallbladder wall thickening, or biliary dilatation. Pancreas: Unremarkable. No pancreatic ductal dilatation or surrounding inflammatory changes. Spleen: Normal in size without focal abnormality. Adrenals/Urinary Tract: Adrenal glands are unremarkable. Kidneys are normal, without renal calculi, focal lesion, or hydronephrosis. Bladder is unremarkable. Stomach/Bowel: Stomach is within normal limits. Appendix appears normal. No evidence of bowel wall thickening, distention, or inflammatory changes. Vascular/Lymphatic: No significant vascular findings are present. No enlarged abdominal or pelvic lymph nodes. Reproductive: Prostate is unremarkable. Other: No abdominal wall hernia or abnormality. No abdominopelvic ascites. Musculoskeletal: No acute or significant osseous findings. IMPRESSION: No CT  evidence of acute abnormality in the abdomen or pelvis. Electronically Signed   By: Donavan Foil M.D.   On: 06/15/2022 21:07    Procedures Procedures    Medications Ordered in ED Medications  ondansetron (ZOFRAN) injection 4 mg (4 mg Intravenous Given 06/15/22 1938)  sodium chloride 0.9 % bolus 1,000 mL (0 mLs Intravenous Stopped 06/15/22 2310)  iohexol (OMNIPAQUE) 300 MG/ML solution 100 mL (80 mLs Intravenous Contrast Given 06/15/22 2047)  sodium chloride 0.9 % bolus 1,000 mL (1,000 mLs Intravenous New Bag/Given 06/15/22 2306)  ondansetron (ZOFRAN) injection 4 mg (4 mg Intravenous Given 06/15/22 2306)    ED Course/ Medical Decision Making/ A&P                             Medical Decision Making  This  patient is a 32 y.o. male  who presents to the ED for concern of nausea, vomiting, abdominal pain, Concern for dehydration with witnessed syncope in ED.   Differential diagnoses prior to evaluation: The emergent differential diagnosis includes, but is not limited to,  esophagitis, gastritis, peptic ulcer disease, esophageal rupture, gastric rupture, Boerhaave's, Mallory-Weiss, pancreatitis, cholecystitis, cholangitis, acute mesenteric ischemia, atypical chest pain or ACS, lower lobar pneumonia versus other . This is not an exhaustive differential.   Past Medical History / Co-morbidities: Previous history of acid reflux, otherwise noncontributory past medical history  Physical Exam: Physical exam performed. The pertinent findings include: Patient with dry mucous membranes on exam, he has some tenderness to palpation of the abdomen without overt rebound, rigidity, guarding.  He is tachycardic but with normal rhythm, overall with clinical signs and symptoms of acute dehydration secondary to frequent emesis.  Lab Tests/Imaging studies: I personally interpreted labs/imaging and the pertinent results include: CMP notable for elevated BUN, creatinine suggesting prerenal dehydration, total  bilirubin mildly elevated at 1.5, patient additionally with mild anion gap likely from ketoacidosis, UA supports this with high specific gravity, ketones, protein.  No evidence of overt infection.  CBC notable for mild leukocytosis with white blood cells 1.4, thrombocytosis platelets 436.  Overall with high normal hemoglobin, and mildly elevated white blood cells and platelets additionally adds credence to clinical criteria of acute dehydration..  I independently interpreted CT abdomen pelvis with contrast which shows no evidence of acute intra-abdominal abnormality.  I agree with the radiologist interpretation.  Medications: I ordered medication including patient received 2 L fluid bolus, Zofran for nausea and dehydration.  I have reviewed the patients home medicines and have made adjustments as needed.  Will discharge with Zofran, and omeprazole, and encouraged GI follow-up as needed.   Disposition: After consideration of the diagnostic results and the patients response to treatment, I feel that overall with clinical signs and symptoms of gastroenteritis versus gastritis, versus acid reflux, gastric ulcer, with clinical signs symptoms of dehydration causing syncope.  Patient with no evidence of neurogenic or cardiogenic syncope.  He is feeling improved after 2 L fluid bolus and nausea medication.  He is able to tolerate p.o. prior to discharge.   emergency department workup does not suggest an emergent condition requiring admission or immediate intervention beyond what has been performed at this time. The plan is: as above. The patient is safe for discharge and has been instructed to return immediately for worsening symptoms, change in symptoms or any other concerns.  Final Clinical Impression(s) / ED Diagnoses Final diagnoses:  Nausea and vomiting, unspecified vomiting type    Rx / DC Orders ED Discharge Orders          Ordered    ondansetron (ZOFRAN) 4 MG tablet  Every 6 hours        06/16/22  0014    omeprazole (PRILOSEC) 20 MG capsule  Daily        06/16/22 0014              Dorien Chihuahua 45/40/98 1191    Delora Fuel, MD 47/82/95 [phone]

## 2022-06-16 NOTE — Discharge Instructions (Signed)
Your lab work today suggested that you are dehydrated, your abdominal pain may be because of the gastroenteritis, gastritis, gastric ulcer, acid reflux or another nonemergent, nonsurgical cause.  Please take the nausea medication as prescribed, as well as the acid reflux medication daily, I recommend following up with GI if you continue to have ongoing nausea, vomiting, abdominal pain.  Please drink plenty of electrolyte-containing fluids such as Pedialyte, Gatorade, and return to the emergency department if your pain worsens instead of improving.
